# Patient Record
Sex: Female | Born: 1945 | Race: White | Hispanic: No | Marital: Married | Smoking: Never smoker
Health system: Southern US, Community
[De-identification: ages and names within clinical notes are randomized; demographics above are authoritative.]

## PROBLEM LIST (undated history)

## (undated) DIAGNOSIS — Z889 Allergy status to unspecified drugs, medicaments and biological substances status: Secondary | ICD-10-CM

## (undated) DIAGNOSIS — H9190 Unspecified hearing loss, unspecified ear: Secondary | ICD-10-CM

## (undated) DIAGNOSIS — K219 Gastro-esophageal reflux disease without esophagitis: Secondary | ICD-10-CM

## (undated) DIAGNOSIS — D333 Benign neoplasm of cranial nerves: Secondary | ICD-10-CM

## (undated) DIAGNOSIS — I10 Essential (primary) hypertension: Secondary | ICD-10-CM

## (undated) DIAGNOSIS — M5126 Other intervertebral disc displacement, lumbar region: Secondary | ICD-10-CM

## (undated) DIAGNOSIS — R001 Bradycardia, unspecified: Secondary | ICD-10-CM

## (undated) DIAGNOSIS — F32A Depression, unspecified: Secondary | ICD-10-CM

## (undated) DIAGNOSIS — F329 Major depressive disorder, single episode, unspecified: Secondary | ICD-10-CM

## (undated) DIAGNOSIS — K5090 Crohn's disease, unspecified, without complications: Secondary | ICD-10-CM

## (undated) DIAGNOSIS — M1990 Unspecified osteoarthritis, unspecified site: Secondary | ICD-10-CM

## (undated) DIAGNOSIS — E785 Hyperlipidemia, unspecified: Secondary | ICD-10-CM

## (undated) HISTORY — PX: ESOPHAGEAL DILATION: SHX303

## (undated) HISTORY — DX: Crohn's disease, unspecified, without complications: K50.90

## (undated) HISTORY — PX: ABDOMINAL HYSTERECTOMY: SHX81

## (undated) HISTORY — PX: OTHER SURGICAL HISTORY: SHX169

## (undated) HISTORY — DX: Gastro-esophageal reflux disease without esophagitis: K21.9

## (undated) HISTORY — PX: TUBAL LIGATION: SHX77

## (undated) HISTORY — PX: PILONIDAL CYST EXCISION: SHX744

## (undated) HISTORY — DX: Unspecified osteoarthritis, unspecified site: M19.90

## (undated) HISTORY — DX: Hyperlipidemia, unspecified: E78.5

## (undated) HISTORY — DX: Essential (primary) hypertension: I10

## (undated) HISTORY — DX: Benign neoplasm of cranial nerves: D33.3

## (undated) HISTORY — PX: APPENDECTOMY: SHX54

## (undated) HISTORY — PX: CHOLECYSTECTOMY: SHX55

## (undated) HISTORY — PX: COLONOSCOPY: SHX174

---

## 1997-12-27 ENCOUNTER — Emergency Department (HOSPITAL_COMMUNITY): Admission: EM | Admit: 1997-12-27 | Discharge: 1997-12-27 | Payer: Self-pay | Admitting: Emergency Medicine

## 1998-11-12 ENCOUNTER — Encounter: Payer: Self-pay | Admitting: Surgery

## 1998-11-14 ENCOUNTER — Ambulatory Visit (HOSPITAL_COMMUNITY): Admission: RE | Admit: 1998-11-14 | Discharge: 1998-11-15 | Payer: Self-pay | Admitting: Surgery

## 1999-03-04 ENCOUNTER — Encounter (INDEPENDENT_AMBULATORY_CARE_PROVIDER_SITE_OTHER): Payer: Self-pay

## 1999-03-04 ENCOUNTER — Inpatient Hospital Stay (HOSPITAL_COMMUNITY): Admission: RE | Admit: 1999-03-04 | Discharge: 1999-03-06 | Payer: Self-pay | Admitting: Obstetrics and Gynecology

## 1999-04-23 ENCOUNTER — Encounter: Admission: RE | Admit: 1999-04-23 | Discharge: 1999-04-23 | Payer: Self-pay | Admitting: Obstetrics and Gynecology

## 1999-04-23 ENCOUNTER — Encounter: Payer: Self-pay | Admitting: Obstetrics and Gynecology

## 1999-05-29 ENCOUNTER — Encounter: Payer: Self-pay | Admitting: Unknown Physician Specialty

## 1999-05-29 ENCOUNTER — Encounter: Admission: RE | Admit: 1999-05-29 | Discharge: 1999-05-29 | Payer: Self-pay | Admitting: Unknown Physician Specialty

## 2000-05-10 ENCOUNTER — Encounter: Admission: RE | Admit: 2000-05-10 | Discharge: 2000-05-10 | Payer: Self-pay | Admitting: Obstetrics and Gynecology

## 2000-05-10 ENCOUNTER — Encounter: Payer: Self-pay | Admitting: Obstetrics and Gynecology

## 2000-10-15 ENCOUNTER — Encounter: Payer: Self-pay | Admitting: Internal Medicine

## 2000-10-15 ENCOUNTER — Ambulatory Visit (HOSPITAL_COMMUNITY): Admission: RE | Admit: 2000-10-15 | Discharge: 2000-10-15 | Payer: Self-pay | Admitting: Internal Medicine

## 2001-04-29 ENCOUNTER — Encounter (HOSPITAL_COMMUNITY): Admission: RE | Admit: 2001-04-29 | Discharge: 2001-06-28 | Payer: Self-pay | Admitting: Internal Medicine

## 2001-07-18 ENCOUNTER — Encounter: Payer: Self-pay | Admitting: Obstetrics and Gynecology

## 2001-07-18 ENCOUNTER — Encounter: Admission: RE | Admit: 2001-07-18 | Discharge: 2001-07-18 | Payer: Self-pay | Admitting: Obstetrics and Gynecology

## 2001-07-29 ENCOUNTER — Encounter: Payer: Self-pay | Admitting: Internal Medicine

## 2001-07-29 ENCOUNTER — Ambulatory Visit (HOSPITAL_COMMUNITY): Admission: RE | Admit: 2001-07-29 | Discharge: 2001-07-29 | Payer: Self-pay | Admitting: Internal Medicine

## 2001-08-26 ENCOUNTER — Encounter (HOSPITAL_COMMUNITY): Admission: RE | Admit: 2001-08-26 | Discharge: 2001-11-24 | Payer: Self-pay | Admitting: Internal Medicine

## 2001-09-05 ENCOUNTER — Ambulatory Visit (HOSPITAL_COMMUNITY): Admission: RE | Admit: 2001-09-05 | Discharge: 2001-09-05 | Payer: Self-pay | Admitting: Internal Medicine

## 2001-09-05 ENCOUNTER — Encounter: Payer: Self-pay | Admitting: Internal Medicine

## 2001-12-16 ENCOUNTER — Encounter (HOSPITAL_COMMUNITY): Admission: RE | Admit: 2001-12-16 | Discharge: 2002-03-16 | Payer: Self-pay | Admitting: Internal Medicine

## 2002-04-07 ENCOUNTER — Encounter (HOSPITAL_COMMUNITY): Admission: RE | Admit: 2002-04-07 | Discharge: 2002-07-06 | Payer: Self-pay | Admitting: Internal Medicine

## 2002-04-11 ENCOUNTER — Ambulatory Visit (HOSPITAL_COMMUNITY): Admission: RE | Admit: 2002-04-11 | Discharge: 2002-04-11 | Payer: Self-pay | Admitting: Internal Medicine

## 2002-07-31 ENCOUNTER — Encounter (HOSPITAL_COMMUNITY): Admission: RE | Admit: 2002-07-31 | Discharge: 2002-10-29 | Payer: Self-pay | Admitting: Internal Medicine

## 2002-10-10 ENCOUNTER — Encounter: Payer: Self-pay | Admitting: Obstetrics and Gynecology

## 2002-10-10 ENCOUNTER — Encounter: Admission: RE | Admit: 2002-10-10 | Discharge: 2002-10-10 | Payer: Self-pay | Admitting: Obstetrics and Gynecology

## 2002-11-20 ENCOUNTER — Encounter: Payer: Self-pay | Admitting: Internal Medicine

## 2002-11-20 ENCOUNTER — Encounter (HOSPITAL_COMMUNITY): Admission: RE | Admit: 2002-11-20 | Discharge: 2002-11-20 | Payer: Self-pay | Admitting: Internal Medicine

## 2003-01-15 ENCOUNTER — Encounter (HOSPITAL_COMMUNITY): Admission: RE | Admit: 2003-01-15 | Discharge: 2003-04-15 | Payer: Self-pay | Admitting: Internal Medicine

## 2003-05-04 ENCOUNTER — Encounter (HOSPITAL_COMMUNITY): Admission: RE | Admit: 2003-05-04 | Discharge: 2003-08-02 | Payer: Self-pay | Admitting: Internal Medicine

## 2003-06-05 ENCOUNTER — Emergency Department (HOSPITAL_COMMUNITY): Admission: EM | Admit: 2003-06-05 | Discharge: 2003-06-05 | Payer: Self-pay | Admitting: Emergency Medicine

## 2003-08-22 ENCOUNTER — Ambulatory Visit (HOSPITAL_COMMUNITY): Admission: RE | Admit: 2003-08-22 | Discharge: 2003-08-22 | Payer: Self-pay | Admitting: Gastroenterology

## 2003-10-19 ENCOUNTER — Encounter: Admission: RE | Admit: 2003-10-19 | Discharge: 2003-10-19 | Payer: Self-pay | Admitting: Family Medicine

## 2003-10-28 IMAGING — CT CT ABDOMEN W/ CM
1 of 3 series · 14 of 32 positions shown, 19 images · non-contrast
Comparison: none

FINDINGS
CLINICAL DATA: HX CROHN'S DISEASE & PANCREATITIS.
CT ABDOMEN W/CONTRAST
THERE IS SOME LINEAR SCARRING AT BOTH BASES WITH NO NODULE AND NO EFFUSION.  THE LIVER, SPLEEN AND
PANCREAS ARE NORMAL.  THERE IS A 1.2 CM CALCIFIED SPLENIC ARTERY ANEURYSM.  THE KIDNEYS AND
RETROPERITONEAL STRUCTURES THAT ARE SEEN ARE NORMAL.  THERE IS DEGENERATIVE CHANGE OF THE THORACIC
AND LUMBAR SPINE WITH SCOLIOSIS.  THERE ARE ALSO SCATTERED SCLEROTIC FOCI THROUGHOUT THE LOWER
THORACIC AND LUMBAR SPINE.  THE SPLENIC SIZE IS PROMINENT WITH NO FOCAL SPLENIC LESION.
IMPRESSION
1.  NO EVIDENCE FOR PANCREATITIS.
2.  NO ACUTE ABNORMALITY WITHIN THE ABDOMEN.
CT PELVIS W/CONTRAST
ADDITIONAL IMAGES THROUGH THE PELVIS AFTER ORAL AND INTRAVENOUS CONTRAST DEMONSTRATE NO MASS,
ADENOPATHY OR INFLAMMATORY CHANGE.  THERE ARE NO CHANGES OF ACTIVE CROHN'S DISEASE, WITH NO
ABSCESS.  AS WITHIN THE THORACIC AND LUMBAR SPINE, THERE ARE SCLEROTIC FOCI SEEN THROUGHOUT THE
PELVIC BONES.
1.  NO EVIDENCE FOR ACTIVE CROHN'S DISEASE OR ABSCESS.
2.  SCLEROTIC FOCI SEEN THROUGHOUT THE THORACIC AND LUMBAR SPINE AND PELVIS.  I HAVE NO HISTORY
THAT THIS PATIENT HAS A PREVIOUS PRIMARY MALIGNANCY SUCH AS BREAST CARCINOMA.  CONCEIVABLY, THESE
COULD REPRESENT MULTIPLE BONE ISLANDS.  HOWEVER, I WOULD RECOMMEND A BONE SCAN TO FURTHER EVALUATE
THIS PATIENT, AS WELL AS ENSURING THAT THE PATIENT HAS HAD A RECENT MAMMOGRAM.
3.  THE PATIENT HAS HAD A PREVIOUS APPENDECTOMY, HYSTERECTOMY AND CHOLECYSTECTOMY.

[Series 2: abd/pelvis 5.0 b30f · axial · 0.59mm/px · z∈[-438,-94]mm · 14 of 79 slices shown, 19 images]
[im 5/79  soft-tissue]
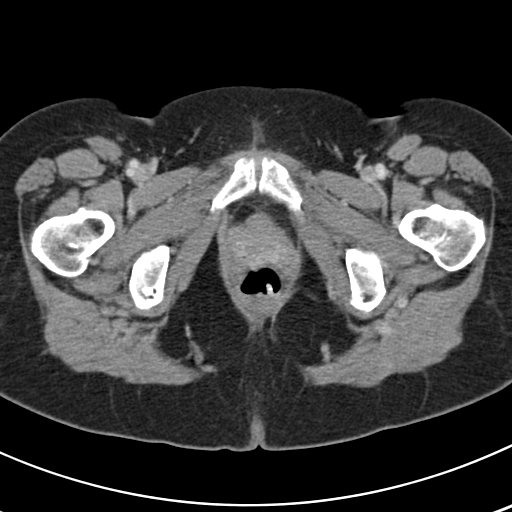
[im 5/79  bone]
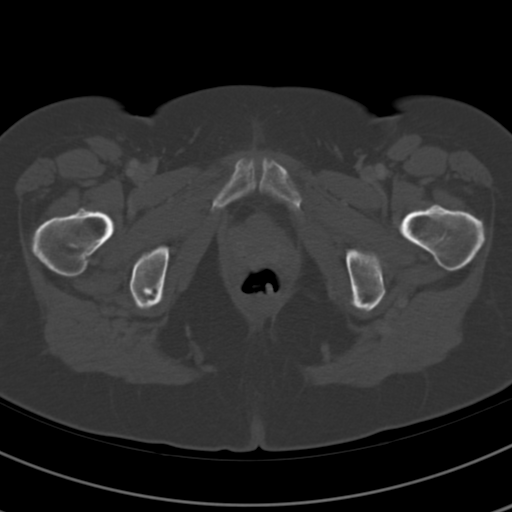
[im 9/79  soft-tissue]
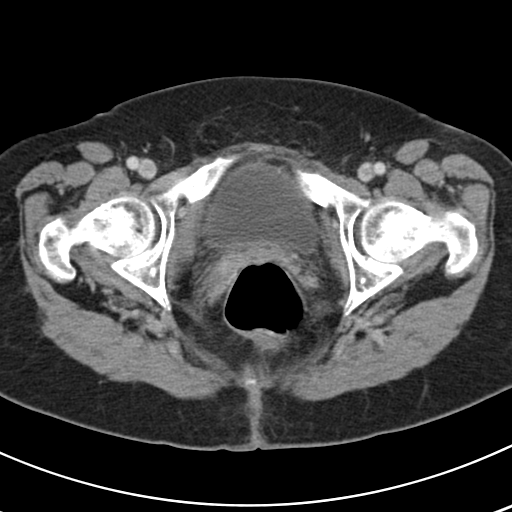
[im 18/79  soft-tissue]
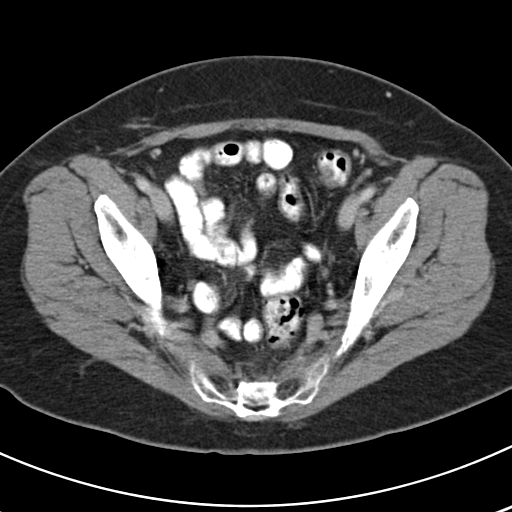
[im 22/79  soft-tissue]
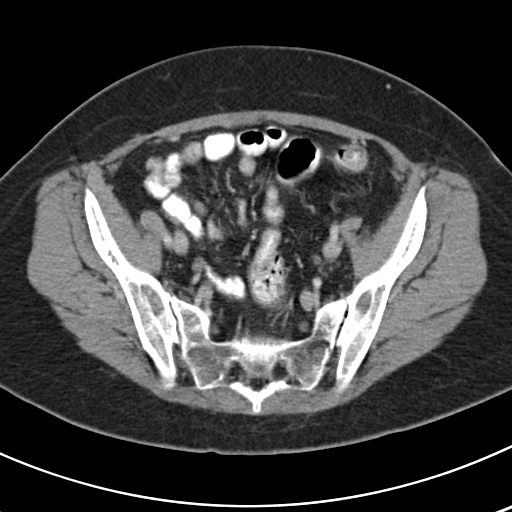
[im 27/79  soft-tissue]
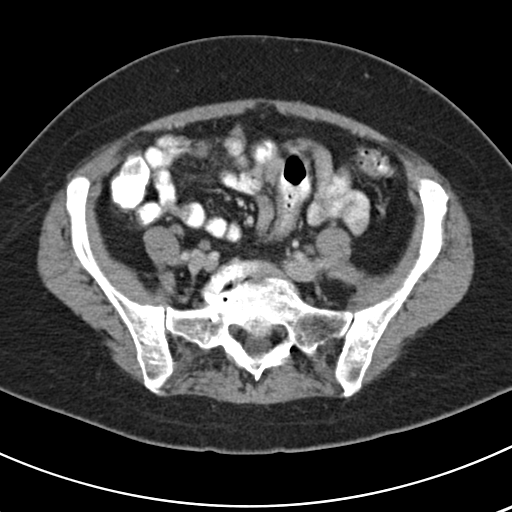
[im 35/79  soft-tissue]
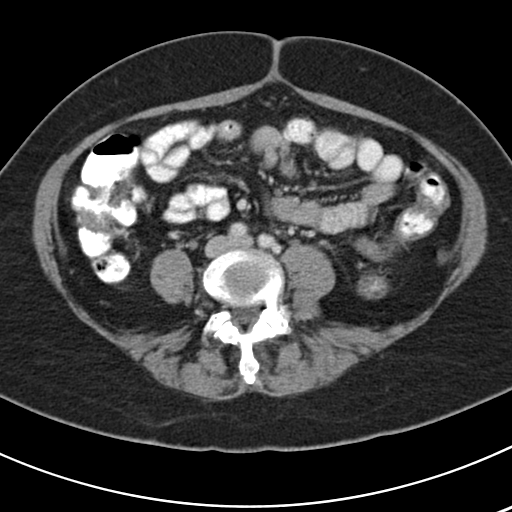
[im 40/79  soft-tissue]
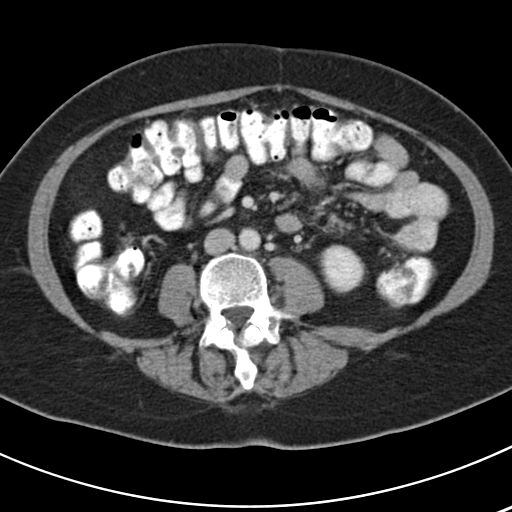
[im 44/79  soft-tissue]
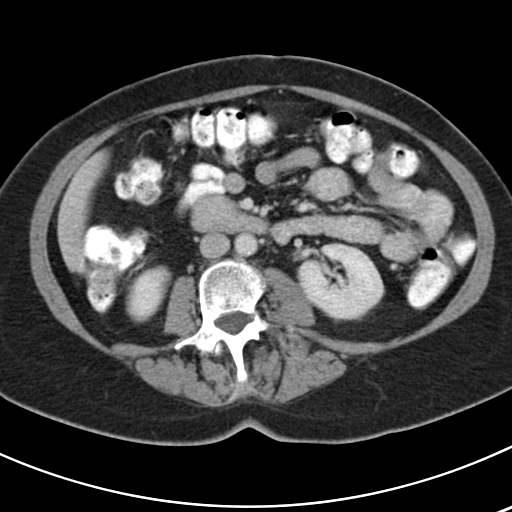
[im 53/79  soft-tissue]
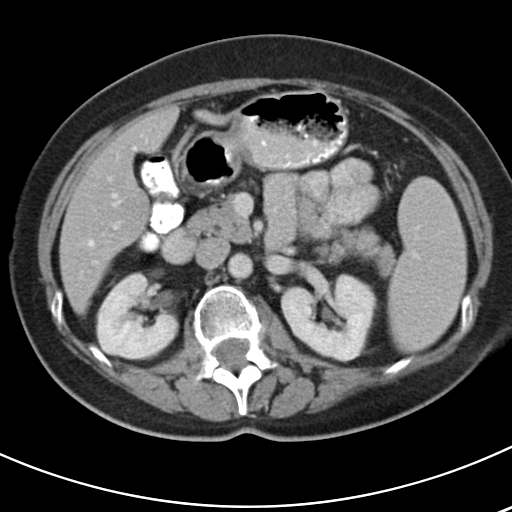
[im 53/79  bone]
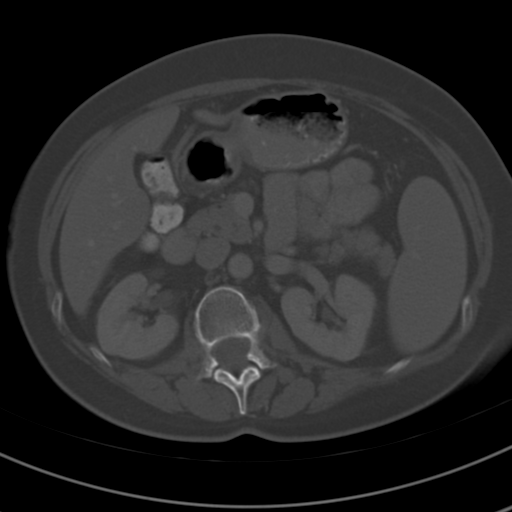
[im 57/79  soft-tissue]
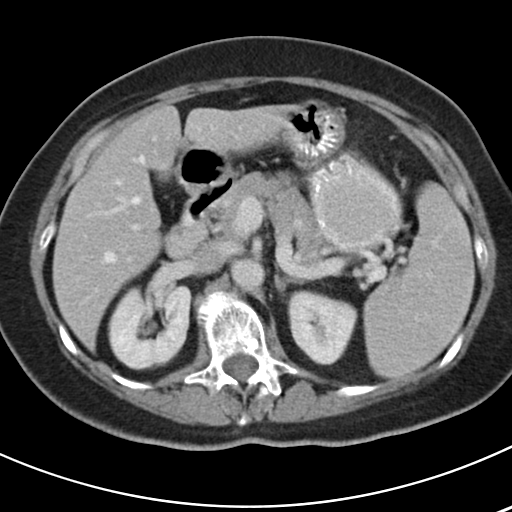
[im 61/79  soft-tissue]
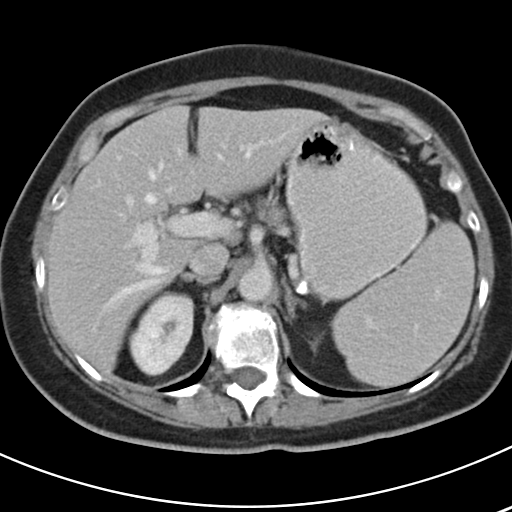
[im 61/79  lung]
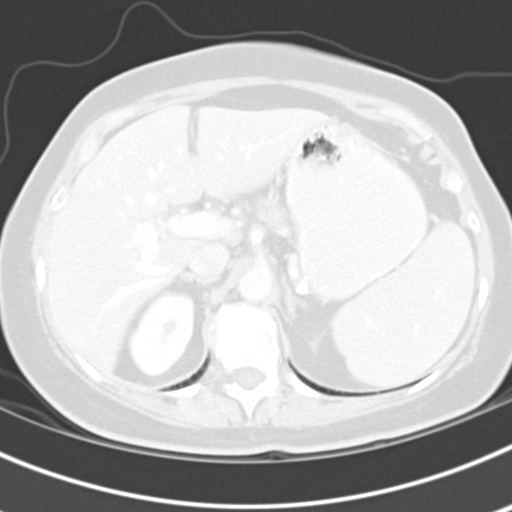
[im 66/79  lung]
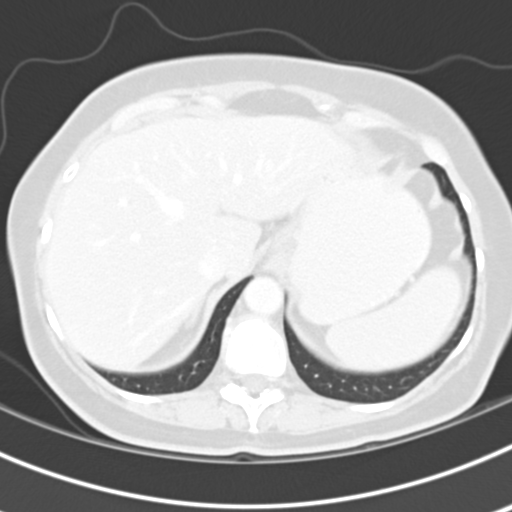
[im 70/79  soft-tissue]
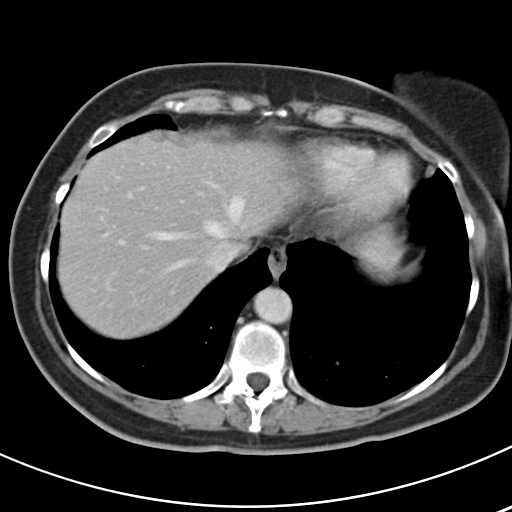
[im 70/79  lung]
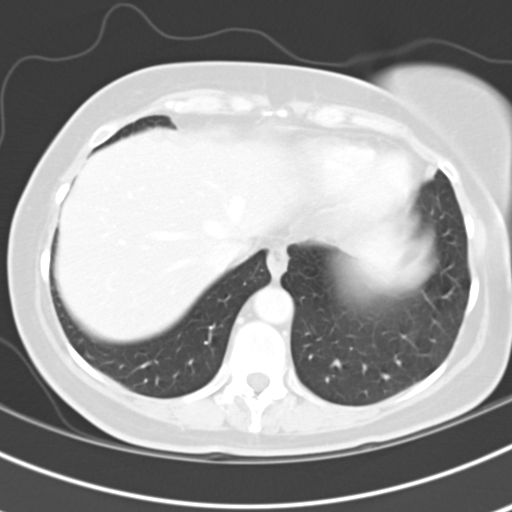
[im 74/79  soft-tissue]
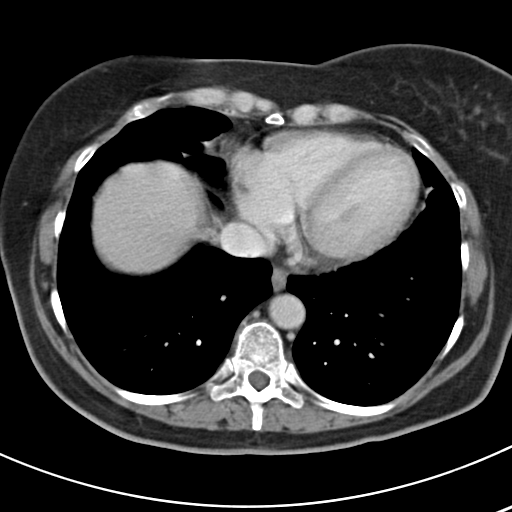
[im 74/79  lung]
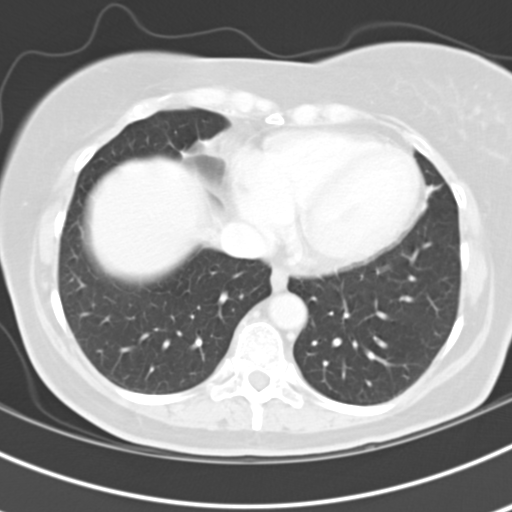

[14 of 32 positions shown; findings below may reference images not displayed]

## 2004-05-19 ENCOUNTER — Encounter: Admission: RE | Admit: 2004-05-19 | Discharge: 2004-05-19 | Payer: Self-pay | Admitting: Gastroenterology

## 2004-06-13 ENCOUNTER — Ambulatory Visit (HOSPITAL_COMMUNITY): Admission: RE | Admit: 2004-06-13 | Discharge: 2004-06-13 | Payer: Self-pay | Admitting: Gastroenterology

## 2004-06-13 ENCOUNTER — Encounter (INDEPENDENT_AMBULATORY_CARE_PROVIDER_SITE_OTHER): Payer: Self-pay | Admitting: Specialist

## 2004-12-23 ENCOUNTER — Encounter: Admission: RE | Admit: 2004-12-23 | Discharge: 2004-12-23 | Payer: Self-pay | Admitting: Unknown Physician Specialty

## 2005-01-01 ENCOUNTER — Encounter: Admission: RE | Admit: 2005-01-01 | Discharge: 2005-01-01 | Payer: Self-pay | Admitting: Unknown Physician Specialty

## 2005-10-19 ENCOUNTER — Encounter: Admission: RE | Admit: 2005-10-19 | Discharge: 2006-01-17 | Payer: Self-pay

## 2006-01-13 ENCOUNTER — Encounter: Admission: RE | Admit: 2006-01-13 | Discharge: 2006-01-13 | Payer: Self-pay | Admitting: Unknown Physician Specialty

## 2006-01-18 ENCOUNTER — Encounter: Admission: RE | Admit: 2006-01-18 | Discharge: 2006-01-26 | Payer: Self-pay

## 2006-03-15 ENCOUNTER — Encounter: Admission: RE | Admit: 2006-03-15 | Discharge: 2006-03-28 | Payer: Self-pay | Admitting: Unknown Physician Specialty

## 2006-03-29 ENCOUNTER — Encounter: Admission: RE | Admit: 2006-03-29 | Discharge: 2006-04-05 | Payer: Self-pay | Admitting: Unknown Physician Specialty

## 2006-10-07 ENCOUNTER — Encounter: Admission: RE | Admit: 2006-10-07 | Discharge: 2006-10-07 | Payer: Self-pay | Admitting: Gastroenterology

## 2006-10-29 ENCOUNTER — Encounter: Admission: RE | Admit: 2006-10-29 | Discharge: 2006-10-29 | Payer: Self-pay | Admitting: Specialist

## 2006-11-25 ENCOUNTER — Encounter: Admission: RE | Admit: 2006-11-25 | Discharge: 2006-11-25 | Payer: Self-pay | Admitting: Specialist

## 2007-01-27 ENCOUNTER — Encounter: Admission: RE | Admit: 2007-01-27 | Discharge: 2007-01-27 | Payer: Self-pay | Admitting: Unknown Physician Specialty

## 2007-02-03 ENCOUNTER — Encounter: Admission: RE | Admit: 2007-02-03 | Discharge: 2007-02-03 | Payer: Self-pay | Admitting: Specialist

## 2007-05-06 ENCOUNTER — Emergency Department (HOSPITAL_COMMUNITY): Admission: EM | Admit: 2007-05-06 | Discharge: 2007-05-06 | Payer: Self-pay | Admitting: Emergency Medicine

## 2007-06-16 ENCOUNTER — Encounter: Admission: RE | Admit: 2007-06-16 | Discharge: 2007-06-16 | Payer: Self-pay | Admitting: Specialist

## 2007-07-29 ENCOUNTER — Encounter: Admission: RE | Admit: 2007-07-29 | Discharge: 2007-07-29 | Payer: Self-pay | Admitting: Gastroenterology

## 2007-09-09 ENCOUNTER — Encounter: Admission: RE | Admit: 2007-09-09 | Discharge: 2007-09-09 | Payer: Self-pay | Admitting: Specialist

## 2007-09-26 ENCOUNTER — Encounter (INDEPENDENT_AMBULATORY_CARE_PROVIDER_SITE_OTHER): Payer: Self-pay | Admitting: Gastroenterology

## 2007-09-26 ENCOUNTER — Ambulatory Visit (HOSPITAL_COMMUNITY): Admission: RE | Admit: 2007-09-26 | Discharge: 2007-09-26 | Payer: Self-pay | Admitting: Gastroenterology

## 2007-11-25 ENCOUNTER — Encounter: Admission: RE | Admit: 2007-11-25 | Discharge: 2007-11-25 | Payer: Self-pay | Admitting: Specialist

## 2008-02-01 ENCOUNTER — Encounter: Admission: RE | Admit: 2008-02-01 | Discharge: 2008-02-01 | Payer: Self-pay | Admitting: Internal Medicine

## 2008-03-12 ENCOUNTER — Encounter: Admission: RE | Admit: 2008-03-12 | Discharge: 2008-03-12 | Payer: Self-pay | Admitting: Specialist

## 2008-03-30 ENCOUNTER — Ambulatory Visit (HOSPITAL_COMMUNITY): Admission: RE | Admit: 2008-03-30 | Discharge: 2008-03-30 | Payer: Self-pay | Admitting: Internal Medicine

## 2008-07-05 ENCOUNTER — Encounter: Admission: RE | Admit: 2008-07-05 | Discharge: 2008-07-05 | Payer: Self-pay | Admitting: Specialist

## 2008-11-09 ENCOUNTER — Encounter: Admission: RE | Admit: 2008-11-09 | Discharge: 2008-11-09 | Payer: Self-pay | Admitting: Specialist

## 2009-01-25 ENCOUNTER — Ambulatory Visit (HOSPITAL_COMMUNITY): Admission: RE | Admit: 2009-01-25 | Discharge: 2009-01-25 | Payer: Self-pay | Admitting: Gastroenterology

## 2009-01-25 ENCOUNTER — Encounter (INDEPENDENT_AMBULATORY_CARE_PROVIDER_SITE_OTHER): Payer: Self-pay | Admitting: Gastroenterology

## 2009-02-01 ENCOUNTER — Encounter: Admission: RE | Admit: 2009-02-01 | Discharge: 2009-02-01 | Payer: Self-pay | Admitting: Internal Medicine

## 2009-02-08 ENCOUNTER — Encounter: Admission: RE | Admit: 2009-02-08 | Discharge: 2009-02-08 | Payer: Self-pay | Admitting: Internal Medicine

## 2009-05-02 ENCOUNTER — Encounter: Admission: RE | Admit: 2009-05-02 | Discharge: 2009-05-02 | Payer: Self-pay | Admitting: Specialist

## 2009-08-22 ENCOUNTER — Encounter: Admission: RE | Admit: 2009-08-22 | Discharge: 2009-08-22 | Payer: Self-pay | Admitting: Internal Medicine

## 2010-02-10 ENCOUNTER — Encounter: Admission: RE | Admit: 2010-02-10 | Discharge: 2010-02-10 | Payer: Self-pay | Admitting: Internal Medicine

## 2010-10-28 ENCOUNTER — Other Ambulatory Visit: Payer: Self-pay | Admitting: Specialist

## 2010-10-28 DIAGNOSIS — M48061 Spinal stenosis, lumbar region without neurogenic claudication: Secondary | ICD-10-CM

## 2010-10-31 ENCOUNTER — Ambulatory Visit
Admission: RE | Admit: 2010-10-31 | Discharge: 2010-10-31 | Disposition: A | Discharge status: Home / Self Care | Payer: BC Managed Care – PPO | Source: Ambulatory Visit | Attending: Specialist | Admitting: Specialist

## 2010-10-31 DIAGNOSIS — M48061 Spinal stenosis, lumbar region without neurogenic claudication: Secondary | ICD-10-CM

## 2010-11-11 NOTE — Op Note (Signed)
Kristine Cook, Kristine Cook                ACCOUNT NO.:  [id number]   MEDICAL RECORD NO.:  [id number]          PATIENT TYPE:  AMB   LOCATION:  ENDO                         FACILITY:  [facility]   PHYSICIAN:  Petra Kuba, M.D.    DATE OF BIRTH:  12/20/1945   DATE OF PROCEDURE:  09/26/2007  DATE OF DISCHARGE:                               OPERATIVE REPORT   PROCEDURE:  Esophagogastroduodenoscopy with biopsy, Savary dilatation.   INDICATIONS:  Patient with dysphagia, upper abdominal pain, abnormal  barium swallow.  Consent was signed after risks, benefits, methods,  options thoroughly discussed with the patient and her husband in the  office.   MEDICINES USED:  Fentanyl 100 mcg, Versed 10 mg.   PROCEDURE:  The video endoscope was inserted by direct vision.  She did  have a tortuous esophagus and a small hiatal hernia.  No significant  ring or stricture was seen.  Scope passed easily into the stomach and  advanced to the antrum where some minimal linear antritis was seen and  advanced through a normal pylorus into a normal duodenal bulb around the  C-loop to a second portion of the duodenum which had a small semisessile  polyp.  Multiple cold biopsies were obtained.  Scope was withdrawn back  to the bulb and a good look there ruled out abnormalities in that  location.  Scope was withdrawn back in the stomach and retroflexed.  High in the cardia, the hiatal hernia was confirmed.  The angularis,  fundus, lesser and greater curve were evaluated on retroflex and on  straight visualization without additional findings.  Scope was slowly  withdrawn back to about 18 cm just below the upper esophageal sphincter,  again confirming normal esophagus.  Scope was then re-advanced to the  antrum and under fluoro guidance, a Savary wire was advanced.  The  customary J loop of the wire was confirmed both endoscopically and under  fluoro.  Scope was removed under fluoro, making sure to keep the wire in  the  proper location and then we proceeded with the Savary 14 and 16 mm  dilators under fluoro in the customary fashion.  The 14 and 16 both  advanced without resistance.  There was trace heme on the 16 only.  Once  the 16 was confirmed in the stomach, the wire was withdrawn back into  the dilator.  Both were removed in tandem.  We stopped the procedure at  this junction.  The patient tolerated the procedure well.  There was no  obvious immediate complication.   ENDOSCOPIC DIAGNOSES:  1. Small hiatal hernia with a tortuous distal esophagus.  2. Minimal antritis.  3. Small duodenal second portion polyp biopsy.  4. Otherwise normal esophagogastroduodenoscopy, therapy Savary      dilatation under fluoro to 16 mm.   PLAN:  Await pathology.  See how the dilation works.  Follow-up p.r.n.  or in six weeks to recheck symptoms and decide any other workup plans or  more aggressive dilatation.          ______________________________  Petra Kuba, M.D.  MEM/MEDQ  D:  09/26/2007  T:  09/26/2007  Job:  [id number]   cc:   Roney Marion, M.D.

## 2010-11-11 NOTE — Op Note (Signed)
Kristine Cook, Kristine Cook                ACCOUNT NO.:  [id number]   MEDICAL RECORD NO.:  [id number]          PATIENT TYPE:  AMB   LOCATION:  ENDO                         FACILITY:  [facility]   PHYSICIAN:  Petra Kuba, M.D.    DATE OF BIRTH:  06/27/1946   DATE OF PROCEDURE:  01/25/2009  DATE OF DISCHARGE:                               OPERATIVE REPORT   PROCEDURE:  Esophagogastroduodenoscopy with polypectomy and dilation.   INDICATIONS:  Patient with dysphagia and duodenal polyp, helped with  dilatation in the past, want to re-dilate and reevaluate the polyp.  Consent was signed after risks, benefits, methods, options thoroughly  discussed multiple times in the past with both the patient and her  husband.   MEDICINES USED:  Benadryl 50 mg, fentanyl 125 mcg, Versed 12.5 mg.   PROCEDURE:  The video endoscope was inserted by direct vision.  The  esophagus was normal.  In the distal esophagus she did have some  tortuosity, a widely patent fibrous ring and a small hiatal hernia.  The  scope was able to be advanced into the stomach and advanced to the  antrum, pertinent for some linear antritis, and around the C-loop to the  second portion of the duodenum, where the previously-seen polyp was  found and seen to be slightly bigger than on previous endoscopies.  We  elected to go ahead and snare the polyp on a setting of 150 at 15 and  very gently used cautery.  The first piece was removed, grabbed with the  snare, suctioned onto the head of the scope and both the scope and the  polyps were removed in tandem and the polyp was recovered.  The scope  was reinserted, again confirming the esophageal findings mentioned  above, which was normal, except for the distal esophagus.  The stomach  was then evaluated on strained retroflexed visualization with good look  at the angularis, lesser and greater curve, cardia and fundus on  retroflexion, which confirmed the hiatal hernia.  Straight visualization  of  the stomach did not reveal any additional findings.  Scope was  reinserted to the duodenum.  Again, the duodenal bulb was normal and we  looked at the polypectomy site, which did seemed to have a little bit of  residual, which was snared, electrocautery applied on the lower setting  as above.  The piece was removed, suctioned through the scope and  collected in the trap.  The polypectomy site was then washed and watched  and there were no signs of bleeding and no signs obviously of residual  polyp.  We elected not to do any further therapy.  There were no signs  of complications.   The patient actually woke up from the procedure at this junction.  She  was not having any pain and we elected to proceed with the dilation and  give her just a little bit more medicine at this junction.  The scope  was withdrawn back into the stomach.  The Savary wire was advanced and  confirmed at the proper position.  Endoscopically and under fluoro the  customary J loop of the wire was confirmed.  The scope was removed,  making sure to keep the wire in the proper position and then we  proceeded, once the scope was removed with the Savary 16-mm dilator  only, which was confirmed under fluoro in the stomach.  The wire was  withdrawn back into the dilator.  There was no resistance on advancing  the dilator.  The dilator and wire were removed in tandem.  There was no  heme on the dilator.  The patient tolerated the procedure well.  There  was no obvious immediate complication.   ENDOSCOPIC DIAGNOSIS:  1. Small hiatal hernia with a tortuous distal esophagus and a widely      patent ring, status post dilation under fluoro to 16 mm without      heme or resistance.  2. Minimal antritis.  3. Second portion of the duodenum polyp slight increase in size,      status post snare x2 on a setting of 150 and 15 with seemingly good      results endoscopically and retrieving both pieces.  4. Otherwise within normal  limits EGD.   PLAN:  1. Await pathology.  2. Follow up p.r.n. or in 2-3 months.  3. No aspirin or nonsteroidals for 2 weeks, which she does not use      anyhow.  4. Clear liquids for 12 hours and slowly advance diet.  5. Call me sooner p.r.n.  6. Await pathology to determine if we should re-look at the      polypectomy site.           ______________________________  Petra Kuba, M.D.     MEM/MEDQ  D:  01/25/2009  T:  01/25/2009  Job:  [id number]   cc:   Larina Earthly, M.D.  Fax: [phone]

## 2010-11-14 NOTE — Op Note (Signed)
NAMEHALIA, Kristine Cook                ACCOUNT NO.:  [id number]   MEDICAL RECORD NO.:  [id number]          PATIENT TYPE:  AMB   LOCATION:  ENDO                         FACILITY:  [facility]   PHYSICIAN:  Petra Kuba, M.D.    DATE OF BIRTH:  [birth year]   DATE OF PROCEDURE:  06/13/2004  DATE OF DISCHARGE:                                 OPERATIVE REPORT   PROCEDURE:  Colonoscopy with biopsy.   INDICATIONS FOR PROCEDURE:  Crohn's with increased symptoms want to  reevaluate.   Consent was signed after risks, benefits, methods, and options were  thoroughly discussed multiple times in the past.   MEDICINES USED:  Demerol 80, Versed 9.   DESCRIPTION OF PROCEDURE:  Rectal inspection was pertinent for small  external hemorrhoids. Digital exam was negative. The pediatric video  adjustable colonoscope was inserted and with abdominal pressure fairly  easily able to advance around the colon to the cecum.  Other than some rare  minimal erythema, no abnormalities were seen on insertion except for left  occasional diverticula.  The cecum was identified by the appendiceal orifice  and the ileocecal valve. In fact, the scope was inserted a short ways into  the terminal ileum which was normal. Photo documentation was obtained.  Scattered biopsies were obtained and put in the first container. The scope  was slowly withdrawn. The prep was adequate. There was some liquid stool  that required washing and suctioning. On slow withdrawal through the colon,  random biopsies were obtained.  The ones on the right side and descending  put in the second container, sigmoid and rectum put on the third container.  Other than the rare small patches of erythema or red spots some of which  were biopsied, no other abnormalities were seen except for the left  occasional diverticula.  Specifically, no signs of significant Crohn's.  Possibly there was just a little more erythema in the sigmoid than anywhere  else but that was  minimal.  Anal rectal pullthrough and retroflexion  confirmed some small hemorrhoids.  The scope was straightened and readvanced  a short ways up the left side of the colon, air was suctioned, scope  removed. The patient tolerated the procedure well. There was no obvious or  immediate complications.   ENDOSCOPIC DIAGNOSIS:  1.  Internal and external hemorrhoids.  2.  Left occasional diverticula.  3.  Rare red spot in the colon questionable significance.  4.  Otherwise within normal limits to the terminal ileum status post      biopsies throughout.   PLAN:  Await pathology.  Slowly weaned steroids. Continue __________ since  that seems to be working better than the Azulfidine.  Followup p.r.n. or in  six weeks to recheck symptoms and see if we can continue to wean steroids at  that junction, although will need to discuss with her neurosurgeon whether  okay to completely wean her off those.      MEM/MEDQ  D:  06/13/2004  T:  06/13/2004  Job:  [id number]   cc:   Roney Marion, M.D.

## 2010-11-26 ENCOUNTER — Other Ambulatory Visit (INDEPENDENT_AMBULATORY_CARE_PROVIDER_SITE_OTHER): Payer: Self-pay | Admitting: General Surgery

## 2010-11-26 DIAGNOSIS — K862 Cyst of pancreas: Secondary | ICD-10-CM

## 2010-11-28 ENCOUNTER — Ambulatory Visit
Admission: RE | Admit: 2010-11-28 | Discharge: 2010-11-28 | Disposition: A | Discharge status: Home / Self Care | Payer: BC Managed Care – PPO | Source: Ambulatory Visit | Attending: General Surgery | Admitting: General Surgery

## 2010-11-28 DIAGNOSIS — K862 Cyst of pancreas: Secondary | ICD-10-CM

## 2010-11-28 MED ORDER — IOHEXOL 300 MG/ML  SOLN
100.0000 mL | Freq: Once | INTRAMUSCULAR | Status: AC | PRN
  Administered 2010-11-28: 100 mL via INTRAVENOUS

## 2010-12-09 ENCOUNTER — Encounter (INDEPENDENT_AMBULATORY_CARE_PROVIDER_SITE_OTHER): Payer: Self-pay | Admitting: General Surgery

## 2010-12-09 ENCOUNTER — Ambulatory Visit (HOSPITAL_COMMUNITY)
Admission: RE | Admit: 2010-12-09 | Discharge: 2010-12-09 | Disposition: A | Discharge status: Home / Self Care | Payer: BC Managed Care – PPO | Source: Ambulatory Visit | Attending: Gastroenterology | Admitting: Gastroenterology

## 2010-12-09 ENCOUNTER — Other Ambulatory Visit: Payer: Self-pay | Admitting: Gastroenterology

## 2010-12-09 DIAGNOSIS — K449 Diaphragmatic hernia without obstruction or gangrene: Secondary | ICD-10-CM | POA: Insufficient documentation

## 2010-12-09 DIAGNOSIS — K219 Gastro-esophageal reflux disease without esophagitis: Secondary | ICD-10-CM | POA: Insufficient documentation

## 2010-12-09 DIAGNOSIS — D649 Anemia, unspecified: Secondary | ICD-10-CM | POA: Insufficient documentation

## 2010-12-09 DIAGNOSIS — D1330 Benign neoplasm of unspecified part of small intestine: Secondary | ICD-10-CM | POA: Insufficient documentation

## 2010-12-09 DIAGNOSIS — K5090 Crohn's disease, unspecified, without complications: Secondary | ICD-10-CM | POA: Insufficient documentation

## 2010-12-09 DIAGNOSIS — K5900 Constipation, unspecified: Secondary | ICD-10-CM | POA: Insufficient documentation

## 2010-12-09 DIAGNOSIS — I10 Essential (primary) hypertension: Secondary | ICD-10-CM | POA: Insufficient documentation

## 2010-12-09 DIAGNOSIS — Z79899 Other long term (current) drug therapy: Secondary | ICD-10-CM | POA: Insufficient documentation

## 2010-12-09 DIAGNOSIS — K228 Other specified diseases of esophagus: Secondary | ICD-10-CM | POA: Insufficient documentation

## 2010-12-09 DIAGNOSIS — K2289 Other specified disease of esophagus: Secondary | ICD-10-CM | POA: Insufficient documentation

## 2010-12-09 DIAGNOSIS — D131 Benign neoplasm of stomach: Secondary | ICD-10-CM | POA: Insufficient documentation

## 2010-12-16 ENCOUNTER — Other Ambulatory Visit: Payer: Self-pay | Admitting: Specialist

## 2010-12-16 DIAGNOSIS — M48061 Spinal stenosis, lumbar region without neurogenic claudication: Secondary | ICD-10-CM

## 2010-12-22 ENCOUNTER — Other Ambulatory Visit: Payer: BC Managed Care – PPO

## 2010-12-23 ENCOUNTER — Ambulatory Visit
Admission: RE | Admit: 2010-12-23 | Discharge: 2010-12-23 | Disposition: A | Discharge status: Home / Self Care | Payer: BC Managed Care – PPO | Source: Ambulatory Visit | Attending: Specialist | Admitting: Specialist

## 2010-12-23 DIAGNOSIS — M48061 Spinal stenosis, lumbar region without neurogenic claudication: Secondary | ICD-10-CM

## 2011-01-10 NOTE — Op Note (Signed)
  Kristine Cook, Kristine Cook                ACCOUNT NO.:  [id number]  MEDICAL RECORD NO.:  [id number]  LOCATION:  WLEN                         FACILITY:  [facility]  PHYSICIAN:  Petra Kuba, M.D.    DATE OF BIRTH:  1946-04-17  DATE OF PROCEDURE:  12/09/2010 DATE OF DISCHARGE:                              OPERATIVE REPORT   PROCEDURE:  Esophagogastroduodenoscopy with biopsy.  INDICATIONS:  The patient with history of duodenal polyp, midepigastric abdominal pain, want to re-evaluate.  Consent was signed after risks, benefits, methods, options thoroughly discussed multiple times in the past with both her and her husband.  MEDICINES USED: 1. Fentanyl 100 mcg. 2. Versed 3 mg. 3. Propofol 70 mg.  PROCEDURE NOTE:  The video endoscope was inserted by direct vision.  She did have a moderate-sized hiatal hernia with a tortuous distal esophagus but no ring or stricture was seen.  Scope passed into the stomach, advanced through a normal antrum, normal pylorus into a normal duodenal bulb and around the C-loop to the second portion of the duodenum. Possibly there was just a touch of residual polyp remaining.  Photo documentation and biopsies were obtained and put in the first container. Scope was advanced a little further down possibly to the third part of the duodenum.  No other abnormalities were seen.  Scope was withdrawn back to the bulb and again no additional duodenal abnormalities were seen.  Scope was withdrawn back to the stomach and retroflexed. Angularis, cardia, fundus, lesser and greater curve were evaluated on retroflex and then straight visualization.  A hiatal hernia was confirmed in the cardia.  Along the distal greater curve, a few tiny small gastric polyps were seen and were cold biopsied and put in a second container.  No other abnormalities were seen in the stomach.  Air was suctioned.  Scope slowly withdrawn again.  A good look at the esophagus was normal except for the  tortuosity and hiatal hernia mentioned above.  Scope was removed.  The patient tolerated the procedure well.  There was no obvious immediate complication.  ENDOSCOPIC DIAGNOSES: 1. Moderate hiatal hernia with a tortuous distal esophagus. 2. Few greater curve tiny small gastric polyps biopsied. 3. Questionable tiny residual second portion of the duodenum polyp,     biopsied. 4. Otherwise within normal limits esophagogastroduodenoscopy,     questionably to the third part of the duodenum.  PLAN:  Await pathology.  Continue present management.  Call me p.r.n. and otherwise follow up in 4 months and determine repeat MRI of the pancreas or endoscopic ultrasound at that juncture based on symptoms and call me sooner p.r.n.          ______________________________ Petra Kuba, M.D.     MEM/MEDQ  D:  12/09/2010  T:  12/09/2010  Job:  [id number]  cc:   Dr. __________  Electronically Signed by Vida Rigger M.D. on 01/10/2011 03:02:11 PM

## 2011-01-13 ENCOUNTER — Other Ambulatory Visit: Payer: Self-pay | Admitting: Internal Medicine

## 2011-01-13 DIAGNOSIS — N63 Unspecified lump in unspecified breast: Secondary | ICD-10-CM

## 2011-01-13 DIAGNOSIS — Z1211 Encounter for screening for malignant neoplasm of colon: Secondary | ICD-10-CM

## 2011-02-16 ENCOUNTER — Ambulatory Visit
Admission: RE | Admit: 2011-02-16 | Discharge: 2011-02-16 | Disposition: A | Discharge status: Home / Self Care | Payer: Medicare Other | Source: Ambulatory Visit | Attending: Internal Medicine | Admitting: Internal Medicine

## 2011-02-16 DIAGNOSIS — N63 Unspecified lump in unspecified breast: Secondary | ICD-10-CM

## 2011-02-27 ENCOUNTER — Other Ambulatory Visit: Payer: Self-pay | Admitting: Dermatology

## 2011-04-27 DIAGNOSIS — D333 Benign neoplasm of cranial nerves: Secondary | ICD-10-CM | POA: Insufficient documentation

## 2011-05-25 ENCOUNTER — Encounter (INDEPENDENT_AMBULATORY_CARE_PROVIDER_SITE_OTHER): Payer: Self-pay | Admitting: Neurosurgery

## 2011-07-14 DIAGNOSIS — M6281 Muscle weakness (generalized): Secondary | ICD-10-CM | POA: Insufficient documentation

## 2011-07-14 DIAGNOSIS — H9190 Unspecified hearing loss, unspecified ear: Secondary | ICD-10-CM | POA: Insufficient documentation

## 2011-07-14 DIAGNOSIS — R251 Tremor, unspecified: Secondary | ICD-10-CM | POA: Insufficient documentation

## 2011-07-14 DIAGNOSIS — M21371 Foot drop, right foot: Secondary | ICD-10-CM | POA: Insufficient documentation

## 2011-07-14 DIAGNOSIS — N393 Stress incontinence (female) (male): Secondary | ICD-10-CM | POA: Insufficient documentation

## 2011-07-30 DIAGNOSIS — G919 Hydrocephalus, unspecified: Secondary | ICD-10-CM | POA: Insufficient documentation

## 2011-12-08 ENCOUNTER — Other Ambulatory Visit (INDEPENDENT_AMBULATORY_CARE_PROVIDER_SITE_OTHER): Payer: Self-pay

## 2011-12-08 DIAGNOSIS — K862 Cyst of pancreas: Secondary | ICD-10-CM

## 2011-12-10 ENCOUNTER — Other Ambulatory Visit: Payer: Medicare Other

## 2011-12-14 ENCOUNTER — Telehealth (INDEPENDENT_AMBULATORY_CARE_PROVIDER_SITE_OTHER): Payer: Self-pay

## 2011-12-14 ENCOUNTER — Ambulatory Visit
Admission: RE | Admit: 2011-12-14 | Discharge: 2011-12-14 | Disposition: A | Discharge status: Home / Self Care | Payer: Medicare Other | Source: Ambulatory Visit | Attending: General Surgery | Admitting: General Surgery

## 2011-12-14 DIAGNOSIS — K862 Cyst of pancreas: Secondary | ICD-10-CM

## 2011-12-14 MED ORDER — IOHEXOL 300 MG/ML  SOLN
100.0000 mL | Freq: Once | INTRAMUSCULAR | Status: AC | PRN
  Administered 2011-12-14: 100 mL via INTRAVENOUS

## 2011-12-14 NOTE — Telephone Encounter (Signed)
Pt notified of ct result and will keep 7-1 appt for follow up.

## 2011-12-28 ENCOUNTER — Ambulatory Visit (INDEPENDENT_AMBULATORY_CARE_PROVIDER_SITE_OTHER): Payer: Medicare Other | Admitting: General Surgery

## 2011-12-28 ENCOUNTER — Encounter (INDEPENDENT_AMBULATORY_CARE_PROVIDER_SITE_OTHER): Payer: Self-pay | Admitting: General Surgery

## 2011-12-28 VITALS — BP 124/68 | HR 49 | Temp 98.2°F | Resp 16 | Ht <= 58 in | Wt 143.0 lb

## 2011-12-28 DIAGNOSIS — K863 Pseudocyst of pancreas: Secondary | ICD-10-CM

## 2011-12-28 DIAGNOSIS — K862 Cyst of pancreas: Secondary | ICD-10-CM

## 2011-12-28 NOTE — Progress Notes (Signed)
Patient ID: Kristine Cook, female   DOB: 07-13-1945, 66 y.o.   MRN: [id number]  Chief Complaint  Patient presents with  . Follow-up    yrly panc check    HPI Kristine Cook is a 66 y.o. female.  This patient returns to see me for long-term followup regarding her pancreas.  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 On MRI there were some subcentimeter hyperintense nonenhancing cystic areas within the pancreatic head. The biliary tract looked normal.  She has a past history of pancreatitis in 1990s thought to be related to medications she was receiving for Crohn's disease. She has been followed intermittently by Dr. Vida Rigger for her Crohn's disease, which is Not very active. Her only GI complaint is constipation. . She doesn't have any nausea or vomiting.  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  She returned to see me after one year to make sure that the pancreatic situation is stable. CT scan of the abdomen on December 14, 2011 shows no evidence of pancreatic lesion. No evidence of pancreatic disease. A small splenic artery aneurysm is stable. The gallbladder and uterus are surgically absent. There is a hiatal hernia which is present as well. HPI  Past Medical History  Diagnosis Date  . Crohn's disease   . Hypertension   . GERD (gastroesophageal reflux disease)   . Acoustic neuroma     benign  . Hyperlipidemia   . Osteoporosis   . Arthritis     Past Surgical History  Procedure Date  . Cholecystectomy   . Appendectomy   . Abdominal hysterectomy   . Shunt placed      Family History  Problem Relation Age of Onset  . Hyperlipidemia Father   . Hypertension Father   . Dementia Father     Social History History  Substance Use Topics  . Smoking status: Never Smoker   . Smokeless tobacco: Not on  file  . Alcohol Use: No    Allergies  Allergen Reactions  . Penicillins Anaphylaxis  . Septra (Bactrim) Nausea Only  . Vicodin (Hydrocodone-Acetaminophen) Diarrhea and Nausea And Vomiting    Current Outpatient Prescriptions  Medication Sig Dispense Refill  . ALPRAZolam (XANAX) 1 MG tablet daily.      Marland Kitchen aspirin 81 MG tablet Take 81 mg by mouth daily.      . balsalazide (COLAZAL) 750 MG capsule Three times a day.      . clidinium-chlordiazePOXIDE (LIBRAX) 2.5-5 MG per capsule Three times a day.      . CRESTOR 5 MG tablet daily.      Marland Kitchen desloratadine (CLARINEX) 5 MG tablet Take 5 mg by mouth daily.      Marland Kitchen DILTIAZEM HCL CD 360 MG 24 hr capsule daily.      Marland Kitchen estradiol (ESTRACE) 2 MG tablet daily.      . FeAsp-FeFum -Suc-C-Thre-B12-FA (MULTIGEN PLUS) 50-101-1 MG TABS daily.      . Flaxseed, Linseed, (RA FLAX SEED OIL 1000 PO) Take by mouth.      Marland Kitchen FLUoxetine (PROZAC) 20 MG capsule 3 (three) times daily.       . folic acid (FOLVITE) 1 MG tablet daily.      . metoCLOPramide (REGLAN) 5 MG tablet daily.      Marland Kitchen MICARDIS 80 MG tablet daily.      Marland Kitchen  Multiple Vitamins-Minerals (CENTRUM SILVER PO) Take by mouth.      . Omega-3 Fatty Acids (FISH OIL) 1200 MG CAPS Take by mouth.      . ondansetron (ZOFRAN) 4 MG tablet Take 4 mg by mouth every 8 (eight) hours as needed.      . pantoprazole (PROTONIX) 40 MG tablet daily.      Marland Kitchen spironolactone (ALDACTONE) 25 MG tablet 0.5 tablets daily.        Review of Systems Review of Systems  Constitutional: Negative for fever, chills and unexpected weight change.  HENT: Negative for hearing loss, congestion, sore throat, trouble swallowing and voice change.   Eyes: Negative for visual disturbance.  Respiratory: Negative for cough and wheezing.   Cardiovascular: Negative for chest pain, palpitations and leg swelling.  Gastrointestinal: Positive for constipation. Negative for nausea, vomiting, abdominal pain, diarrhea, blood in stool, abdominal distention and  anal bleeding.  Genitourinary: Negative for hematuria, vaginal bleeding and difficulty urinating.  Musculoskeletal: Positive for back pain. Negative for arthralgias.  Skin: Negative for rash and wound.  Neurological: Negative for seizures, syncope and headaches.  Hematological: Negative for adenopathy. Does not bruise/bleed easily.  Psychiatric/Behavioral: Negative for confusion.    Blood pressure 124/68, pulse 49, temperature 98.2 F (36.8 C), temperature source Temporal, resp. rate 16, height 4\' 9"  (1.448 m), weight 143 lb (64.864 kg).  Physical Exam Physical Exam  Constitutional: She is oriented to person, place, and time. She appears well-developed and well-nourished. No distress.       In wheelchair. Husband with her.  HENT:  Head: Normocephalic and atraumatic.  Nose: Nose normal.  Mouth/Throat: No oropharyngeal exudate.       Left facial palsy  Eyes: Conjunctivae and EOM are normal. Pupils are equal, round, and reactive to light. Left eye exhibits no discharge. No scleral icterus.  Neck: Neck supple. No JVD present. No tracheal deviation present. No thyromegaly present.       VP shunt right neck.  Cardiovascular: Normal rate, regular rhythm, normal heart sounds and intact distal pulses.   No murmur heard.      Rate slow.  Pulmonary/Chest: Effort normal and breath sounds normal. No respiratory distress. She has no wheezes. She has no rales. She exhibits no tenderness.  Abdominal: Soft. Bowel sounds are normal. She exhibits no distension and no mass. There is no tenderness. There is no rebound and no guarding.    Lymphadenopathy:    She has no cervical adenopathy.  Neurological: She is alert and oriented to person, place, and time. She exhibits normal muscle tone. Coordination normal.  Skin: Skin is warm. No rash noted. She is not diaphoretic. No erythema. No pallor.  Psychiatric: She has a normal mood and affect. Her behavior is normal. Judgment and thought content normal.     Data Reviewed Old records. Recent CT scan.  Assessment    Question of microcysts of pancreas. No evidence of progression 13 months following initial scan. Considering stability of scan and absence of GI symptoms, I think she has no significant pancretic disease.  Tiny splenic artery aneurysm, stable and asymptomatic. No surgical intervention warranted at this time  Status post craniotomy for Schwannoma  VP shunt in place. Residual left facial palsy.  History Crohn's disease, relatively quiescent.  Remote history medication-induced pancreatitis  Degenerative disc disease, lumbar spine.    Plan    I discussed all this findings on the CT scan with her. She and her husband werwe reassured about the absence of pancreatic disease.  I mentioned the splenic artery aneurysm to her. I discussed natural history of this, hopefully will remain stable.    I advised her to discuss periodic imaging studies under the guidance of Dr. Larina Earthly for this.  Return to see me p.r.n.       Angelia Mould. Derrell Lolling, M.D., [facility] General and Minimally invasive Surgery Breast and Colorectal Surgery Office:   [phone] Pager:   [phone]  12/28/2011, 3:00 PM

## 2011-12-28 NOTE — Patient Instructions (Signed)
Your recent CT scan looks very good. There is no evidence of pancreatic disease. You have a tiny splenic artery aneurysm that you need to be aware of, but nothing needs to be done about that either.  Continue regular medical followup with Dr. Felipa Eth.  Return to see Dr. Derrell Lolling if any further issues arise.

## 2012-01-25 ENCOUNTER — Encounter (HOSPITAL_COMMUNITY): Payer: Self-pay

## 2012-01-25 ENCOUNTER — Encounter (HOSPITAL_COMMUNITY)
Admission: RE | Admit: 2012-01-25 | Discharge: 2012-01-25 | Disposition: A | Discharge status: Home / Self Care | Payer: Medicare Other | Source: Ambulatory Visit | Attending: Gastroenterology | Admitting: Gastroenterology

## 2012-01-25 HISTORY — DX: Depression, unspecified: F32.A

## 2012-01-25 HISTORY — DX: Major depressive disorder, single episode, unspecified: F32.9

## 2012-01-25 NOTE — Anesthesia Preprocedure Evaluation (Addendum)
Anesthesia Evaluation  Patient identified by MRN, date of birth, ID band Patient awake    Reviewed: Allergy & Precautions, H&P , NPO status , Patient's Chart, lab work & pertinent test results, reviewed documented beta blocker date and time   Airway Mallampati: II TM Distance: >3 FB Neck ROM: full    Dental No notable dental hx.    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 negative psych ROS   GI/Hepatic negative GI ROS, Neg liver ROS, GERD-  Medicated and Controlled,  Endo/Other  negative endocrine ROS  Renal/GU negative Renal ROS  negative genitourinary   Musculoskeletal   Abdominal   Peds  Hematology negative hematology ROS (+)   Anesthesia Other Findings   Reproductive/Obstetrics negative OB ROS                         Anesthesia Physical Anesthesia Plan  ASA: II  Anesthesia Plan: MAC   Post-op Pain Management:    Induction:   Airway Management Planned:   Additional Equipment:   Intra-op Plan:   Post-operative Plan:   Informed Consent: I have reviewed the patients History and Physical, chart, labs and discussed the procedure including the risks, benefits and alternatives for the proposed anesthesia with the patient or authorized representative who has indicated his/her understanding and acceptance.   Dental Advisory Given  Plan Discussed with: CRNA  Anesthesia Plan Comments:         Anesthesia Quick Evaluation

## 2012-01-26 ENCOUNTER — Encounter (HOSPITAL_COMMUNITY): Payer: Self-pay | Admitting: Gastroenterology

## 2012-01-26 ENCOUNTER — Encounter (HOSPITAL_COMMUNITY): Payer: Self-pay | Admitting: *Deleted

## 2012-01-26 ENCOUNTER — Encounter (HOSPITAL_COMMUNITY)
Admission: RE | Disposition: A | Discharge status: Home / Self Care | Payer: Self-pay | Source: Ambulatory Visit | Attending: Gastroenterology

## 2012-01-26 ENCOUNTER — Ambulatory Visit (HOSPITAL_COMMUNITY)
Admission: RE | Admit: 2012-01-26 | Discharge: 2012-01-26 | Disposition: A | Discharge status: Home / Self Care | Payer: Medicare Other | Source: Ambulatory Visit | Attending: Gastroenterology | Admitting: Gastroenterology

## 2012-01-26 ENCOUNTER — Encounter (HOSPITAL_COMMUNITY): Payer: Self-pay | Admitting: Anesthesiology

## 2012-01-26 ENCOUNTER — Ambulatory Visit (HOSPITAL_COMMUNITY): Payer: Medicare Other | Admitting: Anesthesiology

## 2012-01-26 DIAGNOSIS — K228 Other specified diseases of esophagus: Secondary | ICD-10-CM | POA: Insufficient documentation

## 2012-01-26 DIAGNOSIS — K2960 Other gastritis without bleeding: Secondary | ICD-10-CM | POA: Insufficient documentation

## 2012-01-26 DIAGNOSIS — D131 Benign neoplasm of stomach: Secondary | ICD-10-CM | POA: Insufficient documentation

## 2012-01-26 DIAGNOSIS — R1310 Dysphagia, unspecified: Secondary | ICD-10-CM | POA: Insufficient documentation

## 2012-01-26 DIAGNOSIS — K2289 Other specified disease of esophagus: Secondary | ICD-10-CM | POA: Insufficient documentation

## 2012-01-26 DIAGNOSIS — K5900 Constipation, unspecified: Secondary | ICD-10-CM | POA: Insufficient documentation

## 2012-01-26 HISTORY — PX: HOT HEMOSTASIS: SHX5433

## 2012-01-26 HISTORY — DX: Bradycardia, unspecified: R00.1

## 2012-01-26 HISTORY — DX: Other intervertebral disc displacement, lumbar region: M51.26

## 2012-01-26 HISTORY — PX: ESOPHAGOGASTRODUODENOSCOPY: SHX5428

## 2012-01-26 SURGERY — EGD (ESOPHAGOGASTRODUODENOSCOPY)
Anesthesia: Monitor Anesthesia Care

## 2012-01-26 MED ORDER — MIDAZOLAM HCL 5 MG/5ML IJ SOLN
INTRAMUSCULAR | Status: DC | PRN
  Administered 2012-01-26: 2 mg via INTRAVENOUS

## 2012-01-26 MED ORDER — LACTATED RINGERS IV SOLN
INTRAVENOUS | Status: DC | PRN
  Administered 2012-01-26: 08:00:00 via INTRAVENOUS

## 2012-01-26 MED ORDER — FENTANYL CITRATE 0.05 MG/ML IJ SOLN
INTRAMUSCULAR | Status: DC | PRN
  Administered 2012-01-26: 50 ug via INTRAVENOUS

## 2012-01-26 MED ORDER — PROPOFOL 10 MG/ML IV EMUL
INTRAVENOUS | Status: DC | PRN
  Administered 2012-01-26: 100 ug/kg/min via INTRAVENOUS

## 2012-01-26 MED ORDER — LACTATED RINGERS IV SOLN: INTRAVENOUS | Status: DC

## 2012-01-26 MED ORDER — KETAMINE HCL 50 MG/ML IJ SOLN
INTRAMUSCULAR | Status: DC | PRN
  Administered 2012-01-26: 10 mg via INTRAMUSCULAR

## 2012-01-26 NOTE — Transfer of Care (Signed)
Immediate Anesthesia Transfer of Care Note  Patient: Kristine Cook  Procedure(s) Performed: Procedure(s) (LRB): ESOPHAGOGASTRODUODENOSCOPY (EGD) (N/A) HOT HEMOSTASIS (ARGON PLASMA COAGULATION/BICAP) (N/A)  Patient Location: PACU  Anesthesia Type: MAC  Level of Consciousness: awake, alert , oriented, patient cooperative and responds to stimulation  Airway & Oxygen Therapy: Patient Spontanous Breathing and Patient connected to nasal cannula oxygen  Post-op Assessment: Report given to PACU RN, Post -op Vital signs reviewed and stable and Patient moving all extremities X 4  Post vital signs: Reviewed and stable  Complications: No apparent anesthesia complications

## 2012-01-26 NOTE — Anesthesia Postprocedure Evaluation (Signed)
  Anesthesia Post-op Note  Patient: Kristine Cook  Procedure(s) Performed: Procedure(s) (LRB): ESOPHAGOGASTRODUODENOSCOPY (EGD) (N/A) HOT HEMOSTASIS (ARGON PLASMA COAGULATION/BICAP) (N/A)  Patient Location: PACU  Anesthesia Type: MAC  Level of Consciousness: awake and alert   Airway and Oxygen Therapy: Patient Spontanous Breathing  Post-op Pain: mild  Post-op Assessment: Post-op Vital signs reviewed, Patient's Cardiovascular Status Stable, Respiratory Function Stable, Patent Airway and No signs of Nausea or vomiting  Post-op Vital Signs: stable  Complications: No apparent anesthesia complications

## 2012-01-26 NOTE — Op Note (Signed)
[facility] [address] [place], [place]  [address]  ENDOSCOPY PROCEDURE REPORT  PATIENT:  Kristine Cook, Kristine Cook  MR#:  [id number] BIRTHDATE:  August 17, 1945, 65 yrs. old  GENDER:  female  ENDOSCOPIST:  Vida Rigger, MD Referred by:  Chilton Greathouse, M.D.  PROCEDURE DATE:  01/26/2012 PROCEDURE:  EGD with biopsy, 43239, EGD with dilatation over guidewire ASA CLASS:  Class I INDICATIONS:  history of duodenal polyp and dysphasia  MEDICATIONS:  120 mg propofol 2 mg Versed 50 mcg fentanyl TOPICAL ANESTHETIC: Not used  DESCRIPTION OF PROCEDURE:   After the risks benefits and alternatives of the procedure were thoroughly explained, informed consent was obtained.  The Pentax Gastroscope [id number] endoscope was introduced through the mouth and advanced to the second portion of the duodenum, without limitations.  The instrument was slowly withdrawn as the mucosa was fully examined. To decrease the chance of missed lesions the endoscopy was repeated 2 times in toto and the findings are recorded below but there was no signs of residual duodenal but we did biopsy a small proximal stomach polyp and then proceed with a Savary dilatation in the customary fashion without fluoroscopy guidance using the 16 mm dilator only which passed without resistance and once the dilator and wire were removed the procedure was terminated and the patient tolerated the procedure well <<PROCEDUREIMAGES>>  FINDINGS:  1. Tortuous esophagus status post Savary dilatation to 16 mm at the end of the procedure 2. Small proximal gastric polyp biopsied 3. Minimal antritis 4. Otherwise within normal limits EGD without signs of residual duodenal polyp COMPLICATIONS: None  ENDOSCOPIC IMPRESSION:  Above  RECOMMENDATIONS: Await pathology and see how dilation does and followup with me when necessary or in 6 months and call sooner when necessary  REPEAT EXAM:  Pending pathology probably in 3 years just to recheck  duodenum  ______________________________ Vida Rigger, MD  CC:  Chilton Greathouse, MD  n. Rosalie DoctorVida Rigger at 01/26/2012 09:02 AM  Azucena Fallen, [phone]

## 2012-01-26 NOTE — Progress Notes (Signed)
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

## 2012-01-26 NOTE — Preoperative (Signed)
Beta Blockers   Reason not to administer Beta Blockers:Not Applicable 

## 2012-01-27 ENCOUNTER — Encounter (HOSPITAL_COMMUNITY): Payer: Self-pay | Admitting: Gastroenterology

## 2012-02-19 ENCOUNTER — Other Ambulatory Visit: Payer: Self-pay | Admitting: Internal Medicine

## 2012-02-19 DIAGNOSIS — Z1231 Encounter for screening mammogram for malignant neoplasm of breast: Secondary | ICD-10-CM

## 2012-05-03 ENCOUNTER — Ambulatory Visit
Admission: RE | Admit: 2012-05-03 | Discharge: 2012-05-03 | Disposition: A | Discharge status: Home / Self Care | Payer: Medicare Other | Source: Ambulatory Visit | Attending: Internal Medicine | Admitting: Internal Medicine

## 2012-05-03 DIAGNOSIS — Z1231 Encounter for screening mammogram for malignant neoplasm of breast: Secondary | ICD-10-CM

## 2012-05-05 ENCOUNTER — Other Ambulatory Visit: Payer: Self-pay | Admitting: Internal Medicine

## 2012-05-05 DIAGNOSIS — R928 Other abnormal and inconclusive findings on diagnostic imaging of breast: Secondary | ICD-10-CM

## 2012-05-16 ENCOUNTER — Ambulatory Visit
Admission: RE | Admit: 2012-05-16 | Discharge: 2012-05-16 | Disposition: A | Discharge status: Home / Self Care | Payer: Medicare Other | Source: Ambulatory Visit | Attending: Internal Medicine | Admitting: Internal Medicine

## 2012-05-16 DIAGNOSIS — R928 Other abnormal and inconclusive findings on diagnostic imaging of breast: Secondary | ICD-10-CM

## 2013-05-04 ENCOUNTER — Other Ambulatory Visit: Payer: Self-pay

## 2013-05-04 DIAGNOSIS — Z1231 Encounter for screening mammogram for malignant neoplasm of breast: Secondary | ICD-10-CM

## 2013-05-30 ENCOUNTER — Ambulatory Visit
Admission: RE | Admit: 2013-05-30 | Discharge: 2013-05-30 | Disposition: A | Discharge status: Home / Self Care | Payer: Medicare PPO | Source: Ambulatory Visit

## 2013-05-30 DIAGNOSIS — Z1231 Encounter for screening mammogram for malignant neoplasm of breast: Secondary | ICD-10-CM

## 2013-08-07 ENCOUNTER — Other Ambulatory Visit: Payer: Self-pay | Admitting: Gastroenterology

## 2014-05-03 ENCOUNTER — Other Ambulatory Visit: Payer: Self-pay

## 2014-05-03 DIAGNOSIS — Z1231 Encounter for screening mammogram for malignant neoplasm of breast: Secondary | ICD-10-CM

## 2014-07-16 ENCOUNTER — Ambulatory Visit
Admission: RE | Admit: 2014-07-16 | Discharge: 2014-07-16 | Disposition: A | Discharge status: Home / Self Care | Payer: 59 | Source: Ambulatory Visit

## 2014-07-16 DIAGNOSIS — Z1231 Encounter for screening mammogram for malignant neoplasm of breast: Secondary | ICD-10-CM

## 2014-12-21 ENCOUNTER — Other Ambulatory Visit: Payer: Self-pay | Admitting: Gastroenterology

## 2014-12-24 NOTE — Addendum Note (Signed)
Addended byClarene Essex on: 12/24/2014 11:26 AM   Modules accepted: Orders

## 2015-01-02 ENCOUNTER — Encounter (HOSPITAL_COMMUNITY): Payer: Self-pay | Admitting: *Deleted

## 2015-01-08 ENCOUNTER — Ambulatory Visit (HOSPITAL_COMMUNITY): Payer: Medicare PPO | Admitting: Anesthesiology

## 2015-01-08 ENCOUNTER — Ambulatory Visit (HOSPITAL_COMMUNITY)
Admission: RE | Admit: 2015-01-08 | Discharge: 2015-01-08 | Disposition: A | Discharge status: Home / Self Care | Payer: Medicare PPO | Source: Ambulatory Visit | Attending: Gastroenterology | Admitting: Gastroenterology

## 2015-01-08 ENCOUNTER — Encounter (HOSPITAL_COMMUNITY): Payer: Self-pay

## 2015-01-08 ENCOUNTER — Encounter (HOSPITAL_COMMUNITY)
Admission: RE | Disposition: A | Discharge status: Home / Self Care | Payer: Self-pay | Source: Ambulatory Visit | Attending: Gastroenterology

## 2015-01-08 DIAGNOSIS — K317 Polyp of stomach and duodenum: Secondary | ICD-10-CM | POA: Insufficient documentation

## 2015-01-08 DIAGNOSIS — F329 Major depressive disorder, single episode, unspecified: Secondary | ICD-10-CM | POA: Diagnosis not present

## 2015-01-08 DIAGNOSIS — Z79890 Hormone replacement therapy (postmenopausal): Secondary | ICD-10-CM | POA: Diagnosis not present

## 2015-01-08 DIAGNOSIS — Z982 Presence of cerebrospinal fluid drainage device: Secondary | ICD-10-CM | POA: Diagnosis not present

## 2015-01-08 DIAGNOSIS — Z7982 Long term (current) use of aspirin: Secondary | ICD-10-CM | POA: Diagnosis not present

## 2015-01-08 DIAGNOSIS — R1310 Dysphagia, unspecified: Secondary | ICD-10-CM | POA: Insufficient documentation

## 2015-01-08 DIAGNOSIS — Z8719 Personal history of other diseases of the digestive system: Secondary | ICD-10-CM | POA: Diagnosis not present

## 2015-01-08 DIAGNOSIS — K449 Diaphragmatic hernia without obstruction or gangrene: Secondary | ICD-10-CM | POA: Insufficient documentation

## 2015-01-08 DIAGNOSIS — K219 Gastro-esophageal reflux disease without esophagitis: Secondary | ICD-10-CM | POA: Diagnosis not present

## 2015-01-08 DIAGNOSIS — K5090 Crohn's disease, unspecified, without complications: Secondary | ICD-10-CM | POA: Diagnosis not present

## 2015-01-08 DIAGNOSIS — D333 Benign neoplasm of cranial nerves: Secondary | ICD-10-CM | POA: Insufficient documentation

## 2015-01-08 DIAGNOSIS — I10 Essential (primary) hypertension: Secondary | ICD-10-CM | POA: Diagnosis not present

## 2015-01-08 DIAGNOSIS — K2960 Other gastritis without bleeding: Secondary | ICD-10-CM | POA: Diagnosis not present

## 2015-01-08 DIAGNOSIS — F419 Anxiety disorder, unspecified: Secondary | ICD-10-CM | POA: Diagnosis not present

## 2015-01-08 DIAGNOSIS — Z79899 Other long term (current) drug therapy: Secondary | ICD-10-CM | POA: Insufficient documentation

## 2015-01-08 HISTORY — PX: ESOPHAGOGASTRODUODENOSCOPY (EGD) WITH PROPOFOL: SHX5813

## 2015-01-08 HISTORY — PX: SAVORY DILATION: SHX5439

## 2015-01-08 HISTORY — DX: Unspecified hearing loss, unspecified ear: H91.90

## 2015-01-08 HISTORY — PX: HOT HEMOSTASIS: SHX5433

## 2015-01-08 HISTORY — DX: Allergy status to unspecified drugs, medicaments and biological substances: Z88.9

## 2015-01-08 SURGERY — ESOPHAGOGASTRODUODENOSCOPY (EGD) WITH PROPOFOL
Anesthesia: Monitor Anesthesia Care

## 2015-01-08 MED ORDER — PROPOFOL 10 MG/ML IV BOLUS
INTRAVENOUS | Status: AC
  Filled 2015-01-08: qty 20

## 2015-01-08 MED ORDER — SODIUM CHLORIDE 0.9 % IV SOLN: INTRAVENOUS | Status: DC

## 2015-01-08 MED ORDER — PROPOFOL INFUSION 10 MG/ML OPTIME
INTRAVENOUS | Status: DC | PRN
  Administered 2015-01-08: 140 ug/kg/min via INTRAVENOUS

## 2015-01-08 MED ORDER — PROPOFOL 10 MG/ML IV BOLUS
INTRAVENOUS | Status: DC | PRN
  Administered 2015-01-08: 50 mg via INTRAVENOUS

## 2015-01-08 MED ORDER — LIDOCAINE HCL (CARDIAC) 20 MG/ML IV SOLN
INTRAVENOUS | Status: DC | PRN
  Administered 2015-01-08: 50 mg via INTRAVENOUS

## 2015-01-08 MED ORDER — LIDOCAINE HCL (CARDIAC) 20 MG/ML IV SOLN
INTRAVENOUS | Status: AC
  Filled 2015-01-08: qty 5

## 2015-01-08 MED ORDER — LACTATED RINGERS IV SOLN
INTRAVENOUS | Status: DC
  Administered 2015-01-08: 14:00:00 via INTRAVENOUS

## 2015-01-08 SURGICAL SUPPLY — 14 items

## 2015-01-08 NOTE — Transfer of Care (Signed)
Immediate Anesthesia Transfer of Care Note  Patient: Kristine Cook  Procedure(s) Performed: Procedure(s): ESOPHAGOGASTRODUODENOSCOPY (EGD) WITH PROPOFOL (N/A) HOT HEMOSTASIS (ARGON PLASMA COAGULATION/BICAP) (N/A) SAVORY DILATION (N/A)  Patient Location: PACU  Anesthesia Type:MAC  Level of Consciousness: awake, alert  and oriented  Airway & Oxygen Therapy: Patient Spontanous Breathing and Patient connected to face mask oxygen  Post-op Assessment: Report given to RN and Post -op Vital signs reviewed and stable  Post vital signs: Reviewed and stable  Last Vitals:  Filed Vitals:   01/08/15 1313  BP: 172/66  Pulse: 48  Temp: 36.6 C  Resp: 12    Complications: No apparent anesthesia complications

## 2015-01-08 NOTE — Op Note (Signed)
[facility] [place], [address]   ENDOSCOPY PROCEDURE REPORT  PATIENT: Kristine, Cook  MR#: [id number] BIRTHDATE: Sep 09, 1945 , 68  yrs. old GENDER: female ENDOSCOPIST: Clarene Essex, MD REFERRED BY:  Prince Solian, M.D. PROCEDURE DATE:  01/08/2015 PROCEDURE:  EGD w/ biopsy and Savary dilation of esophagus ASA CLASS:     Class III INDICATIONS:  dysphagia.history of duodenal polyp MEDICATIONS: Propofol 200 mg IV and Lidocaine 50 mg IV TOPICAL ANESTHETIC: none  DESCRIPTION OF PROCEDURE: After the risks benefits and alternatives of the procedure were thoroughly explained, informed consent was obtained.  The Pentax EGD Scope endoscope was introduced through the mouth and advanced to the third portion of the duodenum , Without limitations.  The instrument was slowly withdrawn as the mucosa was fully examined. Estimated blood loss is zero unless otherwise noted in this procedure report.    findings are recorded below       Retroflexed views revealed a hiatal hernia.     The scope was then withdrawn from the patient and the procedure completed.  COMPLICATIONS: There were no immediate complications.  ENDOSCOPIC IMPRESSION: 1. Tiny hiatal hernia with tortuous esophagus 2. Mild antritis 3. Few proximal and distal gastric polyp status post biopsy 4. Otherwise within normal limits to the third portion of the duodenum without signs of duodenal polyp recurrence 5. Therapies Savary dilatation with 16 mm dilator only without resistance with very minimal heme on the dilator questionably from biopsies  RECOMMENDATIONS: await pathology see how dilation works call when necessary otherwise follow-up in 6 months  REPEAT EXAM: as needed or pending biopsy results  eSigned:  Clarene Essex, MD 01/08/2015 3:27 PM Revised: 01/08/2015 3:27 PM   [id number] Avva, MD  CPT CODES: ICD CODES:  The ICD and CPT codes recommended by this software are interpretations  from the data that the clinical staff has captured with the software.  The verification of the translation of this report to the ICD and CPT codes and modifiers is the sole responsibility of the health care institution and practicing physician where this report was generated.  [employer] will not be held responsible for the validity of the ICD and CPT codes included on this report.  AMA assumes no liability for data contained or not contained herein. CPT is a Designer, television/film set of the [employer].  PATIENT NAME:  Kristine, Cook MR#: [id number]

## 2015-01-08 NOTE — Progress Notes (Signed)
Kristine Cook 2:45 PM  Subjective: Patient doing well overall since we last saw her in the office without any new medical problems and still with her periodic dysphagia and no other complaint  Objective: Vital signs stable afebrile no acute distress case discussed with her husband exam please see preassessment evaluation  Assessment: Dysphasia and history of duodenal polyp  Plan: Okay to proceed with endoscopy reevaluate the possible polyp and probably proceed with repeat Savary dilation which only seems to help her with anesthesia assistance  [facility] E  Pager [phone] After 5PM or if no answer call [phone]

## 2015-01-08 NOTE — Discharge Instructions (Signed)
Esophagogastroduodenoscopy Care After Refer to this sheet in the next few weeks. These instructions provide you with information on caring for yourself after your procedure. Your caregiver may also give you more specific instructions. Your treatment has been planned according to current medical practices, but problems sometimes occur. Call your caregiver if you have any problems or questions after your procedure.  HOME CARE INSTRUCTIONS  Do not eat or drink anything until the numbing medicine (local anesthetic) has worn off and your gag reflex has returned. You will know that the local anesthetic has worn off when you can swallow comfortably.  Do not drive for 12 hours after the procedure or as directed by your caregiver.  Only take medicines as directed by your caregiver. SEEK MEDICAL CARE IF:   You cannot stop coughing.  You are not urinating at all or less than usual. SEEK IMMEDIATE MEDICAL CARE IF:  You have difficulty swallowing.  You cannot eat or drink.  You have worsening throat or chest pain.  You have dizziness, lightheadedness, or you faint.  You have nausea or vomiting.  You have chills.  You have a fever.  You have severe abdominal pain.  You have black, tarry, or bloody stools. Document Released: 06/01/2012 Document Reviewed: 06/01/2012 [facility] Patient Information 2015 [place]. This information is not intended to replace advice given to you by your health care provider. Make sure you discuss any questions you have with your health care provider. Call if question or problem or in one week for biopsy report and otherwise follow-up as needed or in 6 month per our routine

## 2015-01-08 NOTE — Anesthesia Preprocedure Evaluation (Signed)
Anesthesia Evaluation  Patient identified by MRN, date of birth, ID band Patient awake    Reviewed: Allergy & Precautions, H&P , NPO status , Patient's Chart, lab work & pertinent test results, reviewed documented beta blocker date and time   Airway Mallampati: II  TM Distance: >3 FB Neck ROM: full    Dental no notable dental hx. 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 negative psych ROS   GI/Hepatic negative GI ROS, Neg liver ROS, GERD-  Medicated and Controlled,  Endo/Other  negative endocrine ROS  Renal/GU negative Renal ROS  negative genitourinary   Musculoskeletal   Abdominal   Peds  Hematology negative hematology ROS (+)   Anesthesia Other Findings   Reproductive/Obstetrics negative OB ROS                             Anesthesia Physical  Anesthesia Plan  ASA: II  Anesthesia Plan: MAC   Post-op Pain Management:    Induction:   Airway Management Planned: Natural Airway  Additional Equipment:   Intra-op Plan:   Post-operative Plan:   Informed Consent: I have reviewed the patients History and Physical, chart, labs and discussed the procedure including the risks, benefits and alternatives for the proposed anesthesia with the patient or authorized representative who has indicated his/her understanding and acceptance.   Dental Advisory Given  Plan Discussed with: CRNA  Anesthesia Plan Comments:         Anesthesia Quick Evaluation

## 2015-01-08 NOTE — Anesthesia Postprocedure Evaluation (Signed)
  Anesthesia Post-op Note  Patient: Kristine Cook  Procedure(s) Performed: Procedure(s) (LRB): ESOPHAGOGASTRODUODENOSCOPY (EGD) WITH PROPOFOL (N/A) HOT HEMOSTASIS (ARGON PLASMA COAGULATION/BICAP) (N/A) SAVORY DILATION (N/A)  Patient Location: PACU  Anesthesia Type: MAC  Level of Consciousness: awake and alert   Airway and Oxygen Therapy: Patient Spontanous Breathing  Post-op Pain: mild  Post-op Assessment: Post-op Vital signs reviewed, Patient's Cardiovascular Status Stable, Respiratory Function Stable, Patent Airway and No signs of Nausea or vomiting  Last Vitals:  Filed Vitals:   01/08/15 1540  BP: 146/48  Pulse: 54  Temp:   Resp: 17    Post-op Vital Signs: stable   Complications: No apparent anesthesia complications

## 2015-01-09 ENCOUNTER — Encounter (HOSPITAL_COMMUNITY): Payer: Self-pay | Admitting: Gastroenterology

## 2015-06-14 ENCOUNTER — Other Ambulatory Visit: Payer: Self-pay

## 2015-06-14 DIAGNOSIS — Z1231 Encounter for screening mammogram for malignant neoplasm of breast: Secondary | ICD-10-CM

## 2015-08-06 ENCOUNTER — Ambulatory Visit
Admission: RE | Admit: 2015-08-06 | Discharge: 2015-08-06 | Disposition: A | Discharge status: Home / Self Care | Payer: 59 | Source: Ambulatory Visit

## 2015-08-06 DIAGNOSIS — Z1231 Encounter for screening mammogram for malignant neoplasm of breast: Secondary | ICD-10-CM

## 2016-07-13 ENCOUNTER — Other Ambulatory Visit: Payer: Self-pay | Admitting: Internal Medicine

## 2016-07-13 DIAGNOSIS — Z1231 Encounter for screening mammogram for malignant neoplasm of breast: Secondary | ICD-10-CM

## 2016-08-24 ENCOUNTER — Ambulatory Visit
Admission: RE | Admit: 2016-08-24 | Discharge: 2016-08-24 | Disposition: A | Discharge status: Home / Self Care | Payer: Medicare Other | Source: Ambulatory Visit | Attending: Internal Medicine | Admitting: Internal Medicine

## 2016-08-24 DIAGNOSIS — Z1231 Encounter for screening mammogram for malignant neoplasm of breast: Secondary | ICD-10-CM

## 2016-08-26 ENCOUNTER — Other Ambulatory Visit: Payer: Self-pay | Admitting: Internal Medicine

## 2016-08-26 DIAGNOSIS — R928 Other abnormal and inconclusive findings on diagnostic imaging of breast: Secondary | ICD-10-CM

## 2016-08-28 ENCOUNTER — Ambulatory Visit
Admission: RE | Admit: 2016-08-28 | Discharge: 2016-08-28 | Disposition: A | Discharge status: Home / Self Care | Payer: Medicare Other | Source: Ambulatory Visit | Attending: Internal Medicine | Admitting: Internal Medicine

## 2016-08-28 ENCOUNTER — Other Ambulatory Visit: Payer: Self-pay | Admitting: Internal Medicine

## 2016-08-28 DIAGNOSIS — R928 Other abnormal and inconclusive findings on diagnostic imaging of breast: Secondary | ICD-10-CM

## 2017-01-23 ENCOUNTER — Encounter (HOSPITAL_COMMUNITY): Payer: Self-pay

## 2017-01-23 ENCOUNTER — Ambulatory Visit (HOSPITAL_COMMUNITY)
Admission: EM | Admit: 2017-01-23 | Discharge: 2017-01-23 | Disposition: A | Discharge status: Home / Self Care | Payer: Medicare Other | Attending: Family Medicine | Admitting: Family Medicine

## 2017-01-23 DIAGNOSIS — B029 Zoster without complications: Secondary | ICD-10-CM

## 2017-01-23 MED ORDER — CAPSAICIN 0.025 % EX CREA: TOPICAL_CREAM | Freq: Two times a day (BID) | CUTANEOUS | 0 refills | Status: DC

## 2017-01-23 MED ORDER — VALACYCLOVIR HCL 1 G PO TABS: 1000.0000 mg | ORAL_TABLET | Freq: Three times a day (TID) | ORAL | 0 refills | Status: DC

## 2017-01-23 NOTE — ED Provider Notes (Signed)
  [place]   [id number] 01/23/17 Arrival Time: [place]:  1. Herpes zoster without complication     Meds ordered this encounter  Medications  . valACYclovir (VALTREX) 1000 MG tablet    Sig: Take 1 tablet (1,000 mg total) by mouth 3 (three) times daily.    Dispense:  21 tablet    Refill:  0  . capsaicin (ZOSTRIX) 0.025 % cream    Sig: Apply topically 2 (two) times daily.    Dispense:  60 g    Refill:  0   May use over the counter ibuprofen or acetaminophen as needed. F/U with any concerns or if pain increases. Reviewed expectations re: course of current medical issues. Questions answered. Outlined signs and symptoms indicating need for more acute intervention. Patient verbalized understanding. After Visit Summary given.   SUBJECTIVE:  Kristine Cook is a 71 y.o. female who presents with complaint of a rash on upper R back for a few days. Some discomfort. "Burning pain". Questions shingles. No h/o. No self treatment. OTC analgesics with some help. Afebrile. No new exposures or medications. No recent illnesses.  ROS: As per HPI.   OBJECTIVE:  Vitals:   01/23/17 1538  BP: (!) 145/74  Pulse: 60  Temp: 98.1 F (36.7 C)  TempSrc: Oral  SpO2: 94%     General appearance: alert; no distress HEENT: normocephalic; atraumatic; conjunctivae normal; TMs normal; nasal mucosa normal; oral mucosa normal Skin: R upper back extending around side with patchy erythema; some induration; few small vesicles   Allergies  Allergen Reactions  . Penicillins Anaphylaxis  . Septra [Bactrim] Nausea Only  . Vicodin [Hydrocodone-Acetaminophen] Diarrhea and Nausea And Vomiting    PMHx, SurgHx, SocialHx, Medications, and Allergies were reviewed in the Visit Navigator and updated as appropriate.      Vanessa Kick, MD 01/23/17 [phone]

## 2017-01-23 NOTE — ED Triage Notes (Signed)
Pt presents today with rash on back that occurred on Thursday. Started off with a small bump that turned into a cluster of bumps after while. States she has pain in on the right side of her upper back that wraps around towards her right rib area. Does have an itching burning pain associated with her rash. Thinks it is possible shingles.

## 2017-07-22 ENCOUNTER — Other Ambulatory Visit: Payer: Self-pay | Admitting: Internal Medicine

## 2017-07-22 DIAGNOSIS — Z139 Encounter for screening, unspecified: Secondary | ICD-10-CM

## 2017-09-03 ENCOUNTER — Ambulatory Visit
Admission: RE | Admit: 2017-09-03 | Discharge: 2017-09-03 | Disposition: A | Discharge status: Home / Self Care | Payer: Medicare Other | Source: Ambulatory Visit | Attending: Internal Medicine | Admitting: Internal Medicine

## 2017-09-03 DIAGNOSIS — Z139 Encounter for screening, unspecified: Secondary | ICD-10-CM

## 2018-02-05 DIAGNOSIS — M79671 Pain in right foot: Secondary | ICD-10-CM | POA: Insufficient documentation

## 2018-02-10 DIAGNOSIS — S82831A Other fracture of upper and lower end of right fibula, initial encounter for closed fracture: Secondary | ICD-10-CM | POA: Insufficient documentation

## 2018-07-22 ENCOUNTER — Ambulatory Visit
Admission: RE | Admit: 2018-07-22 | Discharge: 2018-07-22 | Disposition: A | Discharge status: Home / Self Care | Payer: Medicare Other | Source: Ambulatory Visit | Attending: Internal Medicine | Admitting: Internal Medicine

## 2018-07-22 ENCOUNTER — Other Ambulatory Visit: Payer: Self-pay | Admitting: Internal Medicine

## 2018-07-22 DIAGNOSIS — R410 Disorientation, unspecified: Secondary | ICD-10-CM

## 2018-08-08 ENCOUNTER — Other Ambulatory Visit: Payer: Self-pay | Admitting: Internal Medicine

## 2018-08-08 DIAGNOSIS — Z1231 Encounter for screening mammogram for malignant neoplasm of breast: Secondary | ICD-10-CM

## 2018-08-19 ENCOUNTER — Encounter: Payer: Self-pay | Admitting: Neurology

## 2018-09-02 ENCOUNTER — Other Ambulatory Visit: Payer: Medicare Other

## 2018-09-02 ENCOUNTER — Encounter: Payer: Self-pay | Admitting: Neurology

## 2018-09-02 ENCOUNTER — Ambulatory Visit: Payer: Medicare Other | Admitting: Neurology

## 2018-09-02 ENCOUNTER — Other Ambulatory Visit (INDEPENDENT_AMBULATORY_CARE_PROVIDER_SITE_OTHER): Payer: Medicare Other

## 2018-09-02 ENCOUNTER — Other Ambulatory Visit: Payer: Self-pay

## 2018-09-02 VITALS — BP 156/64 | HR 54 | Ht 59.0 in | Wt 145.0 lb

## 2018-09-02 DIAGNOSIS — R4689 Other symptoms and signs involving appearance and behavior: Secondary | ICD-10-CM

## 2018-09-02 DIAGNOSIS — R4189 Other symptoms and signs involving cognitive functions and awareness: Secondary | ICD-10-CM | POA: Diagnosis not present

## 2018-09-02 DIAGNOSIS — R531 Weakness: Secondary | ICD-10-CM

## 2018-09-02 DIAGNOSIS — G20 Parkinson's disease: Secondary | ICD-10-CM

## 2018-09-02 MED ORDER — DONEPEZIL HCL 10 MG PO TABS: ORAL_TABLET | ORAL | 11 refills | Status: DC

## 2018-09-02 MED ORDER — DIVALPROEX SODIUM ER 250 MG PO TB24: ORAL_TABLET | ORAL | 11 refills | Status: DC

## 2018-09-02 NOTE — Progress Notes (Signed)
NEUROLOGY CONSULTATION NOTE  Kristine Cook MRN: [id number] DOB: 05-Sep-1945  Referring provider: Dr. Prince Solian Primary care provider: Dr. Prince Solian  Reason for consult:  confusion  Dear Dr Dagmar Hait:  Thank you for your kind referral of Kristine Cook for consultation of the above symptoms. Although her history is well known to you, please allow me to reiterate it for the purpose of our medical record. The patient was accompanied to the clinic by her husband who also provides collateral information. Records and images were personally reviewed where available.  HISTORY OF PRESENT ILLNESS: This is a pleasant 73 year old right-handed woman with a history of hypertension, hyperlipidemia, left acoustic neuroma s/p gamma knife in 2005, shunt placement in 2004, presenting for evaluation of confusion. She states she is 46. When asked about memory, she states "it worries me, I don't remember my children's names sometimes." Her husband started noticing minor memory changes when she retired in 2009, she has always confused names of distant family members, but symptoms significantly changed in April 2019. She and her husband went on a trip to [place], and he noted she was repeating herself, constantly asking the same thing. She still thought she was in [place] when they got back. She had previously been having difficulty sleeping and taking higher doses of melatonin, her husband thought this was the caused and stopped melatonin which seemed to help for a while. She started having noticeable difficulties again on 07/21/2018 with "unusual talk." She would not recognize her husband, asking her husband or neighbor to look for her husband when he was standing right in front of her. She does not realize she is at home and wants to put her shoes on to go home. Today her husband went for bloodwork and he left her in the car, when he came back she was convinced they left her father (who is deceased) in the  doctor's office. She states she sees and hears her father or family member in the living room, this tends to get worse in the evening. She would get more combative when her husband tries to convince her otherwise. This was initially sporadic in January, but for the past 1-2 weeks has been a daily issue. She was started on Depakote 250mg  daily for behavioral changes last Saturday, no side effects. Her husband reports that she has always had walking difficulties for several years, balance has been worsening the past 2 years, but now forgets that she needs assistance to ambulate and would stand up and fall. Her husband is mostly concerned because these changes seem quite sudden. He has noticed that she is having more difficulty feeding herself with her right hand, with the fork ending up at her chin. She is tired all the time. Her husband denies any staring/unresponsive episodes, but he can tell it is not registering where she is or what is going on. Her husband has been managing medications and finances for the past 25 years. She stopped driving many years ago due to her eyesight/peripheral vision issues. Sleep is good.   She has infrequent headaches. She has occasional spinning when she stands up. She has had some horizontal diplopia since the neuroma. She has neck and back pain. There is occasional tingling in both feet. She has bowel and bladder incontinence and wears adult diapers. She has had tremors for several years affecting writing and using utensils. No anosmia. Her father had vascular dementia. No history of significant head injuries or alcohol use.  She had a head CT on 07/22/2018 with stable right VP shunt, ventricles mildly more prominent compared to Jan 2012, however the sulci are also mildly more prominent. Increased ventricular prominence is favored to be due to interval volume loss/atrophy rather than shunt malfuncation, increasing white matter changes. Images were reviewed by his neurosurgeon  Dr. Salomon Fick, imaging was stable from prior and follow-up MRI was recommended for 2026. Her last MRI brain was in 05/2016 reporting "Similar size and configuration the ventricular system with unchanged right frontal approach ventriculostomy catheter, with tip in the frontal horn of the right lateral ventricle. Similar size of enhancing left CPA angle mass which measures approximately 21 x 16 mm (previously 23 x 17 mm), compatible with a vestibular schwannoma. As previously demonstrated, this lesion widens the porus acousticus, indents the middle cerebellar peduncle, and closely approximates the cisternal portion of the left 5th cranial nerve. Similar extensive T2 FLAIR signal in the periventricular and subcortical white matter, which is nonspecific, but most commonly reflect the sequela of chronic ischemic microvascular disease. Grossly normal flow-related signal in the major intracranial arteries and dural sinuses."   PAST MEDICAL HISTORY: Past Medical History:  Diagnosis Date  . Acoustic neuroma (HCC)    benign; underwent some radiation-Lt ear deafness. wears hearing aid right  . Arthritis    hands, back, knees  . Bradycardia   . Crohn's disease ([place])   . Depression   . GERD (gastroesophageal reflux disease)   . Hearing deficit    deaf left ear, right ear hearing aid  . Hx of seasonal allergies   . Hyperlipidemia   . Hypertension   . Lumbar herniated disc   . Osteoporosis     PAST SURGICAL HISTORY: Past Surgical History:  Procedure Laterality Date  . ABDOMINAL HYSTERECTOMY    . APPENDECTOMY    . CHOLECYSTECTOMY    . COLONOSCOPY    . ESOPHAGEAL DILATION    . ESOPHAGOGASTRODUODENOSCOPY  01/26/2012   Procedure: ESOPHAGOGASTRODUODENOSCOPY (EGD);  Surgeon: Jeryl Columbia, MD;  Location: [facility];  Service: Endoscopy;  Laterality: N/A;  . ESOPHAGOGASTRODUODENOSCOPY (EGD) WITH PROPOFOL N/A 01/08/2015   Procedure: ESOPHAGOGASTRODUODENOSCOPY (EGD) WITH PROPOFOL;  Surgeon: Clarene Essex, MD;   Location: [facility];  Service: Endoscopy;  Laterality: N/A;  . HOT HEMOSTASIS  01/26/2012   Procedure: HOT HEMOSTASIS (ARGON PLASMA COAGULATION/BICAP);  Surgeon: Jeryl Columbia, MD;  Location: [facility];  Service: Endoscopy;  Laterality: N/A;  . HOT HEMOSTASIS N/A 01/08/2015   Procedure: HOT HEMOSTASIS (ARGON PLASMA COAGULATION/BICAP);  Surgeon: Clarene Essex, MD;  Location: [facility];  Service: Endoscopy;  Laterality: N/A;  . PILONIDAL CYST EXCISION    . SAVORY DILATION N/A 01/08/2015   Procedure: SAVORY DILATION;  Surgeon: Clarene Essex, MD;  Location: [facility];  Service: Endoscopy;  Laterality: N/A;  . shunt placed      RT side of head '[facility] "programmable" to assist fluid drainage.  . TUBAL LIGATION      MEDICATIONS: Current Outpatient Medications on File Prior to Visit  Medication Sig Dispense Refill  . ALPRAZolam (XANAX) 1 MG tablet Take 1 mg by mouth daily.     Marland Kitchen aspirin 81 MG tablet Take 81 mg by mouth daily.    . balsalazide (COLAZAL) 750 MG capsule Take 2,250 mg by mouth Three times a day.     . capsaicin (ZOSTRIX) 0.025 % cream Apply topically 2 (two) times daily. 60 g 0  . clidinium-chlordiazePOXIDE (LIBRAX) 2.5-5 MG per capsule Take 1 capsule by mouth  Three times a day.     . CRESTOR 5 MG tablet Take 5 mg by mouth daily.     Marland Kitchen desloratadine (CLARINEX) 5 MG tablet Take 5 mg by mouth daily as needed (allergies).     . DILTIAZEM HCL CD 360 MG 24 hr capsule Take 360 mg by mouth daily.     Marland Kitchen estradiol (ESTRACE) 1 MG tablet Take 1 mg by mouth daily.    . FeAsp-FeFum -Suc-C-Thre-B12-FA (MULTIGEN PLUS) 50-101-1 MG TABS Take 1 tablet by mouth daily.     . Flaxseed, Linseed, (RA FLAX SEED OIL 1000 PO) Take 1 capsule by mouth daily.     Marland Kitchen FLUoxetine (PROZAC) 20 MG capsule Take 20 mg by mouth 3 (three) times daily.     . folic acid (FOLVITE) 1 MG tablet Take 1 mg by mouth daily.     Marland Kitchen levothyroxine (SYNTHROID, LEVOTHROID) 50 MCG tablet Take 50 mcg by mouth daily.  11    . metoCLOPramide (REGLAN) 5 MG tablet Take 5 mg by mouth daily.     Marland Kitchen MICARDIS 80 MG tablet Take 80 mg by mouth daily.     . Multiple Vitamins-Minerals (CENTRUM SILVER PO) Take by mouth.    . Omega-3 Fatty Acids (FISH OIL) 1200 MG CAPS Take 1 capsule by mouth 3 (three) times daily.     . ondansetron (ZOFRAN) 4 MG tablet Take 4 mg by mouth every 8 (eight) hours as needed for nausea.     . pantoprazole (PROTONIX) 40 MG tablet Take 40 mg by mouth 2 (two) times daily.     Marland Kitchen spironolactone (ALDACTONE) 25 MG tablet Take 12.5 mg by mouth daily.     . valACYclovir (VALTREX) 1000 MG tablet Take 1 tablet (1,000 mg total) by mouth 3 (three) times daily. 21 tablet 0   No current facility-administered medications on file prior to visit.     ALLERGIES: Allergies  Allergen Reactions  . Penicillins Anaphylaxis  . Septra [Bactrim] Nausea Only  . Vicodin [Hydrocodone-Acetaminophen] Diarrhea and Nausea And Vomiting    FAMILY HISTORY: Family History  Problem Relation Age of Onset  . Hyperlipidemia Father   . Hypertension Father   . Dementia Father     SOCIAL HISTORY: Social History   Socioeconomic History  . Marital status: Married    Spouse name: Not on file  . Number of children: Not on file  . Years of education: Not on file  . Highest education level: Not on file  Occupational History  . Not on file  Social Needs  . Financial resource strain: Not on file  . Food insecurity:    Worry: Not on file    Inability: Not on file  . Transportation needs:    Medical: Not on file    Non-medical: Not on file  Tobacco Use  . Smoking status: Never Smoker  . Smokeless tobacco: Never Used  Substance and Sexual Activity  . Alcohol use: No  . Drug use: No  . Sexual activity: Not on file  Lifestyle  . Physical activity:    Days per week: Not on file    Minutes per session: Not on file  . Stress: Not on file  Relationships  . Social connections:    Talks on phone: Not on file    Gets  together: Not on file    Attends religious service: Not on file    Active member of club or organization: Not on file    Attends meetings of clubs  or organizations: Not on file    Relationship status: Not on file  . Intimate partner violence:    Fear of current or ex partner: Not on file    Emotionally abused: Not on file    Physically abused: Not on file    Forced sexual activity: Not on file  Other Topics Concern  . Not on file  Social History Narrative  . Not on file    REVIEW OF SYSTEMS: Constitutional: No fevers, chills, or sweats, no generalized fatigue, change in appetite Eyes: No visual changes, double vision, eye pain Ear, nose and throat: No hearing loss, ear pain, nasal congestion, sore throat Cardiovascular: No chest pain, palpitations Respiratory:  No shortness of breath at rest or with exertion, wheezes GastrointestinaI: No nausea, vomiting, diarrhea, abdominal pain, fecal incontinence Genitourinary:  No dysuria, urinary retention or frequency Musculoskeletal:  + neck pain, back pain Integumentary: No rash, pruritus, skin lesions Neurological: as above Psychiatric: No depression, insomnia, anxiety Endocrine: No palpitations, fatigue, diaphoresis, mood swings, change in appetite, change in weight, increased thirst Hematologic/Lymphatic:  No anemia, purpura, petechiae. Allergic/Immunologic: no itchy/runny eyes, nasal congestion, recent allergic reactions, rashes  PHYSICAL EXAM: Vitals:   09/02/18 1303  BP: (!) 156/64  Pulse: (!) 54  SpO2: 96%   General: No acute distress, sitting on wheelchair Head:  Normocephalic/atraumatic Eyes: Fundoscopic exam shows bilateral sharp discs, no vessel changes, exudates, or hemorrhages Neck: supple, no paraspinal tenderness, full range of motion Back: No paraspinal tenderness Heart: regular rate and rhythm Lungs: Clear to auscultation bilaterally. Vascular: No carotid bruits. Skin/Extremities: No rash, no edema Neurological  Exam: Mental status: alert and oriented to person, place, month, no dysarthria or aphasia, Fund of knowledge is reduced. She is noted to have delay in answering questions but answers appropriately. She states she is 29. Recent and remote memory are impaired. Attention and concentration are reduced.   Able to name objects and repeat phrases.  Montreal Cognitive Assessment  09/02/2018  Visuospatial/ Executive (0/5) 2  Naming (0/3) 2  Attention: Read list of digits (0/2) 2  Attention: Read list of letters (0/1) 0  Attention: Serial 7 subtraction starting at 100 (0/3) 0  Language: Repeat phrase (0/2) 1  Language : Fluency (0/1) 0  Abstraction (0/2) 1  Delayed Recall (0/5) 0  Orientation (0/6) 3  Total 11  Adjusted Score (based on education) 12   Cranial nerves: CN I: not tested CN II: pupils equal, round and reactive to light, visual fields intact, fundi unremarkable. CN III, IV, VI:  full range of motion, no nystagmus, no ptosis CN V: facial sensation intact CN VII: upper and lower face symmetric CN VIII: hearing intact to finger rub CN IX, X: gag intact, uvula midline CN XI: sternocleidomastoid and trapezius muscles intact CN XII: tongue midline Bulk & Tone: +cogwheeling, L>R, no fasciculations. Motor: 5/5 throughout with no pronator drift but note of decreased fine finger movements on left hand and orbiting around left arm (indicating mild left-sided weakness) Sensation: intact to light touch, cold, pin, vibration and joint position sense.  No extinction to double simultaneous stimulation.  Deep Tendon Reflexes: +2 throughout, no ankle clonus Plantar responses: downgoing bilaterally Cerebellar: no incoordination on finger to nose testing Gait: unable to stand without 1-2 person assist, she is quite ataxic and cannot take a step, very unsteady Tremor: there is a resting tremor noted in both hands mostly affecting the right 5th digit and left 2nd digit, no postural or endpoint  tremor  IMPRESSION:  This is a 73 year old right-handed woman with a history of  hypertension, hyperlipidemia, left acoustic neuroma s/p gamma knife in 2005, shunt placement in 2004, presenting for evaluation of confusion. Symptoms have started rather acutely last January 2020, her neurological exam shows mild left-sided weakness, with parkinsonian features. She is quite ataxic. MOCA score today 12/30 indicating moderate dementia. She is also having visual and auditory hallucinations. Etiology of symptoms unclear, from her neuroma perspective, she does not need imaging until 2026, however with recent change in symptoms, recommend doing MRI brain with and without contrast to assess for underlying structural abnormality. Bloodwork for rapidly progressive dementia will be ordered. For behavioral changes, we will try increasing Depakote to 250mg  BID. We also discussed how Aricept can help with behaviors associated with dementia, she will start Donepezil 5mg  daily for 2 weeks, then increase to 10mg  daily. Side effects discussed. She will be referred for home PT and OT. Continue 24/7 supervision. Follow-up in 6-8 weeks, they know to call for any changes.   Thank you for allowing me to participate in the care of this patient. Please do not hesitate to call for any questions or concerns.   Ellouise Newer, M.D.  CC: Dr. Dagmar Hait, Dr. Salomon Fick

## 2018-09-02 NOTE — Patient Instructions (Addendum)
1. Schedule MRI brain with and without contrast. We have sent a referral to [place] [facility] for your MRI and they will call you directly to schedule your appt. They are located at [place]. If you need to contact them directly please call [phone].  2. Bloodwork for B12, RPR, ammonia, ESR, ANA, anti-thyroglobulin antibodies, anti-thyroid peroxidase antibodies. Your provider has requested that you have labwork completed today. Please go to [facility] ([id number]) on the second floor of this building before leaving the office today. You do not need to check in. If you are not called within 15 minutes please check with the front desk.   3. Increase Depakote ER 267m: take 1 tablet twice a day  4. After a week of increasing Depakote, start Donepezil 132m Take 1/2 tablet daily for 2 weeks, then increase to 1 tablet daily  5. Refer for home PT/OT. We have sent a referral. [facility] will contact you to schedule a time to schedule your visits.   6. Follow-up in 6-8 weeks, call for any changes

## 2018-09-05 LAB — THYROGLOBULIN LEVEL: THYROGLOBULIN: 12.3 ng/mL

## 2018-09-05 LAB — ANA: Anti Nuclear Antibody(ANA): NEGATIVE

## 2018-09-05 LAB — VITAMIN B12: Vitamin B-12: 1760 pg/mL — ABNORMAL HIGH (ref 200–1100)

## 2018-09-05 LAB — AMMONIA: AMMONIA: 53 umol/L (ref <=72)

## 2018-09-05 LAB — RPR: RPR: NONREACTIVE

## 2018-09-05 LAB — SEDIMENTATION RATE: Sed Rate: 31 mm/h — ABNORMAL HIGH (ref 0–30)

## 2018-09-05 LAB — THYROID PEROXIDASE ANTIBODY: Thyroperoxidase Ab SerPl-aCnc: 1 IU/mL (ref <9)

## 2018-09-07 ENCOUNTER — Telehealth: Payer: Self-pay

## 2018-09-07 DIAGNOSIS — R413 Other amnesia: Secondary | ICD-10-CM

## 2018-09-07 NOTE — Telephone Encounter (Signed)
-----   Message from Cameron Sprang, MD sent at 09/07/2018  9:18 AM EDT ----- Pls let patient and her husband know that bloodwork was normal, thanks

## 2018-09-07 NOTE — Telephone Encounter (Signed)
Order for MRI with and without contrast to be done at [facility] placed in system

## 2018-09-07 NOTE — Telephone Encounter (Signed)
Spoke with pt's husband, Trilby Drummer, relaying results below.  He states that [facility] has contact pt to schedule MRI, and was told that they would not be able to do procedure there due to pt's programmable shunt.  States that he was told he would received call about where pt could have MRI done.  I advised that I would send order to [facility] and get MRI scheduled for pt.  Trilby Drummer states that he and pt would prefer MRI to be scheduled on a Friday due to Melvin's work schedule.

## 2018-09-09 ENCOUNTER — Other Ambulatory Visit: Payer: Self-pay

## 2018-09-09 ENCOUNTER — Ambulatory Visit
Admission: RE | Admit: 2018-09-09 | Discharge: 2018-09-09 | Disposition: A | Discharge status: Home / Self Care | Payer: Medicare Other | Source: Ambulatory Visit | Attending: Internal Medicine | Admitting: Internal Medicine

## 2018-09-09 DIAGNOSIS — Z1231 Encounter for screening mammogram for malignant neoplasm of breast: Secondary | ICD-10-CM

## 2018-09-16 ENCOUNTER — Telehealth: Payer: Self-pay | Admitting: Neurology

## 2018-09-16 NOTE — Telephone Encounter (Signed)
Patient husband would like to talk to someone about the medication depakote. He thinks the dosage needs to be changed

## 2018-09-19 ENCOUNTER — Telehealth: Payer: Self-pay | Admitting: Neurology

## 2018-09-19 ENCOUNTER — Telehealth: Payer: Self-pay | Admitting: *Deleted

## 2018-09-19 NOTE — Telephone Encounter (Signed)
Patient's husband called back and said that his wife is getting confused and having anger issues when she is just taking depakote once a day but she was extremely weak taking it bid.  Is there anything we can add with this?

## 2018-09-19 NOTE — Telephone Encounter (Signed)
Friday, Kristine Cook called after hours service and stated that she has been dizzy and unsteady while walking since increasing Depakote from 250mg  daily to 250mg  twice daily a couple of weeks ago.  I advised to reduce dose back to once daily and contact the office Monday.

## 2018-09-20 ENCOUNTER — Telehealth: Payer: Self-pay | Admitting: Neurology

## 2018-09-20 NOTE — Telephone Encounter (Signed)
She seemed so weak, barely open eyes and hardly stand up with BID dose of Depakote. Depakote reduced to once a day and she has been more herself; only had one episode of paranoia but he thinks he can work through that. With BID, not as good control with her bladder as well. She has been taking 1/2 tab Donepezil for a week also, we agreed to continue with plan to increase after another week since she improved with reducing Depakote (symptoms do not appear due to Aricept). Over the past few days, he has also noticed difficulty with fine finger movements on right hand, she is having difficulty using a fork and feeding herself, but can grab things with right hand. Proceed with MRI as scheduled on Friday.

## 2018-09-20 NOTE — Telephone Encounter (Signed)
See phone note

## 2018-09-21 ENCOUNTER — Ambulatory Visit
Admission: RE | Admit: 2018-09-21 | Discharge: 2018-09-21 | Disposition: A | Discharge status: Home / Self Care | Payer: Medicare Other | Source: Ambulatory Visit | Attending: Neurology | Admitting: Neurology

## 2018-09-21 ENCOUNTER — Other Ambulatory Visit: Payer: Self-pay

## 2018-09-21 DIAGNOSIS — G20 Parkinson's disease: Secondary | ICD-10-CM

## 2018-09-21 DIAGNOSIS — R531 Weakness: Secondary | ICD-10-CM

## 2018-09-21 DIAGNOSIS — R4689 Other symptoms and signs involving appearance and behavior: Secondary | ICD-10-CM

## 2018-09-21 DIAGNOSIS — R4189 Other symptoms and signs involving cognitive functions and awareness: Secondary | ICD-10-CM

## 2018-09-22 ENCOUNTER — Telehealth: Payer: Self-pay | Admitting: Neurology

## 2018-09-22 NOTE — Telephone Encounter (Signed)
MRI at [facility] and patient has a programmable shunt. He said they went there yesterday and they couldn't do this because of her shunt. Please call him back at [phone]. Thanks!

## 2018-09-23 ENCOUNTER — Other Ambulatory Visit: Payer: Medicare Other

## 2018-09-26 NOTE — Telephone Encounter (Signed)
Husband called- relayed msg from [facility] about waiting to schedule MRI at [facility] when they are open to do reg MRI. Aricept is causing her to not sleep well. And he is wanting to take her off of it. She is having a hard time picking up fork with her fingers. HE wants to know how to stop it. Went from half a pill to full pill last couple of days. Please call him back at [phone]. Thanks!

## 2018-09-26 NOTE — Telephone Encounter (Signed)
Noted, thanks!

## 2018-09-26 NOTE — Telephone Encounter (Signed)
Spoke with pt's husband, Trilby Drummer, advising no need to wean Aricept, OK to stop.  Trilby Drummer states that over the weekend pt was "trying to go home" at 2AM." and that it has been very rough the last few days/nights.  Asks if possible to increase Depakote 250mg  from 2Tab QHS to 1Tab AM and 2Tabs QHS.  I told him that I did not see a problem with that.  Trilby Drummer and I agree that he and Dangela will keep April 16 appointment in case there are issues with medications.  Trilby Drummer also wants to keep appointment to talk with Dr. Delice Lesch about possible replacement for Aricept.   Trilby Drummer states that he would prefer pt to have MRI in [place] but does not want to take pt to [facility] at this time ([place]).  I advised that we will get things situated when things settle down.    Verified mobile telephone number for [facility] appointment.  [place] mobile # [phone]

## 2018-10-03 NOTE — Telephone Encounter (Signed)
Spoke with [employer] below.  He states that this AM pt was unable to get out of bed.  during our conversation pt's daughter told Trilby Drummer that pt seems a little more coherent, making some sense while talking.  Carter Kitten that if he felt pt needs to be seen, to contact PCP or take pt to Urgent Care Facility.  Trilby Drummer verbalized understanding.

## 2018-10-03 NOTE — Telephone Encounter (Signed)
Pt's husband, Trilby Drummer, contact office and states that pt is lethargic.  She can grasp a cup, but cannot pick it up and can no longer pick up a spoon/fork.  I advised Trilby Drummer to reduce Depakote back down to 2 tabs daily, advised that he may give either BID or both tabs QHS, whichever works best for pt.   Let Trilby Drummer know that I would send message to Dr. Delice Lesch and return call with any recommendations.   Trilby Drummer CB# [phone]

## 2018-10-03 NOTE — Telephone Encounter (Signed)
Would only take the Depakote at bedtime if she is lethargic. Try doing 2 tabs qhs, then if still lethargic, 1 tab qhs. If still lethargic, may need brain scan (even head CT if unable to do MRI due to shunt). Thanks

## 2018-10-05 ENCOUNTER — Telehealth: Payer: Self-pay | Admitting: Neurology

## 2018-10-05 ENCOUNTER — Telehealth: Payer: Self-pay

## 2018-10-05 ENCOUNTER — Other Ambulatory Visit: Payer: Self-pay | Admitting: Neurology

## 2018-10-05 DIAGNOSIS — R4182 Altered mental status, unspecified: Secondary | ICD-10-CM

## 2018-10-05 DIAGNOSIS — R413 Other amnesia: Secondary | ICD-10-CM

## 2018-10-05 NOTE — Telephone Encounter (Signed)
Verbal per Dr. Delice Lesch - OK to dc Depakote / STAT CT.  Spoke with pt's husband, Trilby Drummer, advising OK to D/c Depakote.  Also advised stat CT orders placed.    Called pam with [facility] to advise of STAT orders.

## 2018-10-05 NOTE — Telephone Encounter (Signed)
Called [facility], spoke with Fabi. Dr. Delice Lesch wanted CT head to be STAT. CTA was cx, Pt to be contacted now to be scheduled.

## 2018-10-05 NOTE — Telephone Encounter (Signed)
Patient's spouse called regarding Jakeira taking her Depakote medication. Her husband does not feel comfortable giving her anymore. He said she was going to wean off but he wants to stop her completely. He was also asking about an Emergency CT Scan.  Please Call. Thanks

## 2018-10-07 ENCOUNTER — Other Ambulatory Visit: Payer: Self-pay

## 2018-10-07 ENCOUNTER — Ambulatory Visit
Admission: RE | Admit: 2018-10-07 | Discharge: 2018-10-07 | Disposition: A | Discharge status: Home / Self Care | Payer: Medicare Other | Source: Ambulatory Visit | Attending: Neurology | Admitting: Neurology

## 2018-10-07 DIAGNOSIS — R4182 Altered mental status, unspecified: Secondary | ICD-10-CM

## 2018-10-11 ENCOUNTER — Telehealth: Payer: Self-pay

## 2018-10-11 NOTE — Telephone Encounter (Signed)
Spoke with pt's husband, Trilby Drummer, relaying message below.  He states that pt seems to be getting better.  Can grasp a cup now, but still not utensils, has better bladder control and her ankle swelling has gone down Trilby Drummer never mentioned swelling before).  States that he would like to wait a few more week before starting a different medications, however agrees that pt does need something.  I took this opportunity to go over pt's medication lists with Trilby Drummer, as pt has upcoming appt with Dr. Delice Lesch on April 16.

## 2018-10-11 NOTE — Telephone Encounter (Signed)
-----   Message from Cameron Sprang, MD sent at 10/10/2018  8:53 AM EDT ----- Pls let Mr. Gowens know that Jerzi's scan did not show any change from her brain scan in January. There is no evidence of bleed, obvious stroke, although MRI would be more sensitive for stroke. How is she feeling off the medications? Is she less drowsy? Thanks

## 2018-10-13 ENCOUNTER — Telehealth (INDEPENDENT_AMBULATORY_CARE_PROVIDER_SITE_OTHER): Payer: Medicare Other | Admitting: Neurology

## 2018-10-13 ENCOUNTER — Encounter: Payer: Self-pay | Admitting: Neurology

## 2018-10-13 ENCOUNTER — Other Ambulatory Visit: Payer: Self-pay

## 2018-10-13 VITALS — BP 138/74 | Temp 98.1°F | Ht 59.0 in | Wt 145.0 lb

## 2018-10-13 DIAGNOSIS — G20 Parkinson's disease: Secondary | ICD-10-CM

## 2018-10-13 DIAGNOSIS — R4182 Altered mental status, unspecified: Secondary | ICD-10-CM

## 2018-10-13 DIAGNOSIS — R4689 Other symptoms and signs involving appearance and behavior: Secondary | ICD-10-CM

## 2018-10-13 DIAGNOSIS — R4189 Other symptoms and signs involving cognitive functions and awareness: Secondary | ICD-10-CM

## 2018-10-13 NOTE — Progress Notes (Signed)
Virtual Visit via Video Note The purpose of this virtual visit is to provide medical care while limiting exposure to the novel coronavirus.    Consent was obtained for video visit:  Yes.   Answered questions that patient had about telehealth interaction:  Yes.   I discussed the limitations, risks, security and privacy concerns of performing an evaluation and management service by telemedicine. I also discussed with the patient that there may be a patient responsible charge related to this service. The patient expressed understanding and agreed to proceed.  Pt location: Home Physician Location: office Name of referring provider:  Prince Solian, MD I connected with Narissa Beaufort Bartolini at patients initiation/request on 10/13/2018 at 11:30 AM EDT by video enabled telemedicine application and verified that I am speaking with the correct person using two identifiers. Pt MRN:  [id number] Pt DOB:  1945-09-06 Video Participants:  Tonia Brooms;  Garrel Ridgel (husband)   History of Present Illness:  The patient was last seen a month ago for sudden change in cognition/behavior that started in January 2020. She had a head CT in January 2020 which showed ventricles mildly more prominent, favored to be due to interval volume loss/atrophy rather than shunt malfunction. Her neurosurgeon reviewed images as well and agreed. She was started on Depakote for behavioral changes and Donepezil for dementia last month. Her husband has called our office several times since her visit to report side effects on medications. She became dizzy, unsteady, and incontinent of urine when he increased Depakote 250mg  to BID. He also reported more difficulty with fine finger movements on right hand, more difficulty using a fork and feeding herself. He was advised to reduce to once a day and these seemed to improve with dose reduction. She had also started Donepezil. These seemed to improve with reduction in Depakote, he started  Donepezil. He called 2 days after reporting Donepezil seemed to worsen sleep and was instructed to stop it. He reported worsening behavioral changes, wanting to "go home at 2AM" and asked if he could increase Depakote again. He then reported that she could no longer pick up a spoon/fork and was lethargic. Repeat head CT done which I personally reviewed did not show any changes from Jan 2020 scan. He stopped the Depakote completely a week ago. He feels that she has been improving, she can now grab a cup and hold something in her fingers (last week could not pick up piece of bacon). She could not open/close her hands last week and had even more trouble standing, this is better as well. He noticed her face was puffy, this has resolved. She could not make it to the toilet for a week, now she is back to her baseline. Off the Depakote, she is more confused and agitated again, hallucinating, and not sleeping well. She is irrational, wanting him to do things he cannot do, and he is unable to talk her out of it, making her angrier (Depakote helped with this). He has noticed she tends to lean a lot to her right side. He does not feel there is focal weakness but feels she is weak diffusely. She has been unable to do the MRI brain due to current Covid-19 restrictions.   History on Initial Assessment 09/02/2018:This is a pleasant 73 year old right-handed woman with a history of hypertension, hyperlipidemia, left acoustic neuroma s/p gamma knife in 2005, shunt placement in 2004, presenting for evaluation of confusion. She states she is 73. When asked about memory, she  states "it worries me, I don't remember my children's names sometimes." Her husband started noticing minor memory changes when she retired in 2009, she has always confused names of distant family members, but symptoms significantly changed in April 2019. She and her husband went on a trip to [place], and he noted she was repeating herself, constantly asking the  same thing. She still thought she was in [place] when they got back. She had previously been having difficulty sleeping and taking higher doses of melatonin, her husband thought this was the caused and stopped melatonin which seemed to help for a while. She started having noticeable difficulties again on 07/21/2018 with "unusual talk." She would not recognize her husband, asking her husband or neighbor to look for her husband when he was standing right in front of her. She does not realize she is at home and wants to put her shoes on to go home. Today her husband went for bloodwork and he left her in the car, when he came back she was convinced they left her father (who is deceased) in the doctor's office. She states she sees and hears her father or family member in the living room, this tends to get worse in the evening. She would get more combative when her husband tries to convince her otherwise. This was initially sporadic in January, but for the past 1-2 weeks has been a daily issue. She was started on Depakote 250mg  daily for behavioral changes last Saturday, no side effects. Her husband reports that she has always had walking difficulties for several years, balance has been worsening the past 2 years, but now forgets that she needs assistance to ambulate and would stand up and fall. Her husband is mostly concerned because these changes seem quite sudden. He has noticed that she is having more difficulty feeding herself with her right hand, with the fork ending up at her chin. She is tired all the time. Her husband denies any staring/unresponsive episodes, but he can tell it is not registering where she is or what is going on. Her husband has been managing medications and finances for the past 25 years. She stopped driving many years ago due to her eyesight/peripheral vision issues. Sleep is good.   She has infrequent headaches. She has occasional spinning when she stands up. She has had some horizontal  diplopia since the neuroma. She has neck and back pain. There is occasional tingling in both feet. She has bowel and bladder incontinence and wears adult diapers. She has had tremors for several years affecting writing and using utensils. No anosmia. Her father had vascular dementia. No history of significant head injuries or alcohol use.   She had a head CT on 07/22/2018 with stable right VP shunt, ventricles mildly more prominent compared to Jan 2012, however the sulci are also mildly more prominent. Increased ventricular prominence is favored to be due to interval volume loss/atrophy rather than shunt malfuncation, increasing white matter changes. Images were reviewed by his neurosurgeon Dr. Salomon Fick, imaging was stable from prior and follow-up MRI was recommended for 2026. Her last MRI brain was in 05/2016 reporting "Similar size and configuration the ventricular system with unchanged right frontal approach ventriculostomy catheter, with tip in the frontal horn of the right lateral ventricle. Similar size of enhancing left CPA angle mass which measures approximately 21 x 16 mm (previously 23 x 17 mm), compatible with a vestibular schwannoma. As previously demonstrated, this lesion widens the porus acousticus, indents the middle cerebellar peduncle, and  closely approximates the cisternal portion of the left 5th cranial nerve. Similar extensive T2 FLAIR signal in the periventricular and subcortical white matter, which is nonspecific, but most commonly reflect the sequela of chronic ischemic microvascular disease. Grossly normal flow-related signal in the major intracranial arteries and dural sinuses."   Observations/Objective:   Vitals:   10/13/18 1042  BP: 138/74  Temp: 98.1 F (36.7 C)  TempSrc: Temporal  Weight: 145 lb (65.8 kg)  Height: 4\' 11"  (1.499 m)   GEN:  The patient appears stated age and is in NAD.  Neurological examination:  Orientation: The patient is awake, states she is 47  initially, then corrects to 72. States year is "[birth year]."  Cranial nerves: There is good facial symmetry. 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  The speech is slightly slurred. No tongue deviation. Hearing is intact to conversational tone. Motor: Strength is at least antigravity x 4.   There is no pronator drift.  Movement examination: Tone: unable Abnormal movements: There is note of a tremor on fingers of left hand, she is bradykinetic (husband reports this is better than a few days ago) Coordination:  There is decremation with RAM's, with opening and closing of hands, finger taps Gait and Station: Not tested, patient was quite ataxic on last visit, needing 2-person assist  Assessment and Plan: This is a 73 year old right-handed woman with a history of  hypertension, hyperlipidemia, left acoustic neuroma s/p gamma knife in 2005, shunt placement in 2004, who presented for confusion that appeared to start rather acutely last January 2020. [place] 12/30 last month. She was having more behavioral changes/hallucinations and was advised to increase Depakote. This seemed to cause more issues which are slowly improving per husband, last dose of Depakote was last week. She is now also off Donepezil. She is more bradykinetic today, appears more parkinsonian on video. Discussed proceeding with MRI brain once able. We have agreed to hold off on starting medication for now, she will be reassessed in a week. Continue 24/7 supervision.   Follow Up Instructions:   -I discussed the assessment and treatment plan with the patient. The patient was provided an opportunity to ask questions and all were answered. The patient agreed with the plan and demonstrated an understanding of the instructions.   The patient was advised to call back or seek an in-person evaluation if the symptoms worsen or if the condition fails to improve as anticipated.    Total Time spent in visit with the patient was 25 minutes, of which more than 50% of the  time was spent in counseling and/or coordinating care on the above.   Pt understands and agrees with the plan of care outlined.     Cameron Sprang, MD

## 2018-10-19 ENCOUNTER — Telehealth: Payer: Self-pay | Admitting: *Deleted

## 2018-10-19 NOTE — Telephone Encounter (Signed)
-----   Message from Cameron Sprang, MD sent at 10/11/2018 12:50 PM EDT ----- Regarding: CT Pls let Mr. Tuma know that Alessa's scan did not show any change from her brain scan in January. There is no evidence of bleed, obvious stroke, although MRI would be more sensitive for stroke. How is she feeling off the medications? Is she less drowsy? Thanks

## 2018-10-19 NOTE — Telephone Encounter (Signed)
Appointment on Monday

## 2018-10-24 ENCOUNTER — Other Ambulatory Visit: Payer: Self-pay

## 2018-10-24 ENCOUNTER — Telehealth (INDEPENDENT_AMBULATORY_CARE_PROVIDER_SITE_OTHER): Payer: Medicare Other | Admitting: Neurology

## 2018-10-24 VITALS — BP 139/88

## 2018-10-24 DIAGNOSIS — R4189 Other symptoms and signs involving cognitive functions and awareness: Secondary | ICD-10-CM

## 2018-10-24 DIAGNOSIS — R4182 Altered mental status, unspecified: Secondary | ICD-10-CM

## 2018-10-24 DIAGNOSIS — R4689 Other symptoms and signs involving appearance and behavior: Secondary | ICD-10-CM

## 2018-10-24 MED ORDER — QUETIAPINE FUMARATE 25 MG PO TABS: ORAL_TABLET | ORAL | 3 refills | Status: DC

## 2018-10-24 NOTE — Progress Notes (Signed)
Virtual Visit via Video Note The purpose of this virtual visit is to provide medical care while limiting exposure to the novel coronavirus.    Consent was obtained for video visit:  Yes.   Answered questions that patient had about telehealth interaction:  Yes.   I discussed the limitations, risks, security and privacy concerns of performing an evaluation and management service by telemedicine. I also discussed with the patient that there may be a patient responsible charge related to this service. The patient expressed understanding and agreed to proceed.  Pt location: Home Physician Location: office Name of referring provider:  Prince Solian, MD I connected with Teliah Buffalo Marcel at patients initiation/request on 10/24/2018 at  1:30 PM EDT by video enabled telemedicine application and verified that I am speaking with the correct person using two identifiers. Pt MRN:  [id number] Pt DOB:  1945/10/17 Video Participants:  Kristine Cook;  Garrel Ridgel (husband)   History of Present Illness: The patient was last seen a week ago for worsening symptoms with medication changes. Her husband is present during this e-visit. She was seen for sudden change in cognition/behavior that started in January 2020, head CT in January 2020 and April 2020 did not show any acute changes, ventricles were mildly more prominent, but felt due to interval volume loss/atrophy rather than shunt malfunction. She had significant motor issues with Depakote (and possibly Donepezil), she has been off medications now for 2 weeks and is definitely improving physically. She is now able to feed herself (previously unable to lift fork or grab cup). She is able to stand better (but still needing support), able to lock her knees now (previously would be unable to stand). She did fall when she tried to get up by herself. Her husband reports that as she is improving physically, she is not so good mentally. She is more confused and agitated  from time to time, hallucinating. Yesterday she wanted to get the phone book to call Trilby Drummer, and despite repeated explanations and showing his driver's license, she would not be convinced that he was not there. She got more irritated and agitated as he tried to convince her, until he gave her the phone book and she called their home number. Confusion does not necessarily occur only in the evening hours. Sleep is good. She is not drowsy during the day anymore. He is worried that her diastolic BP has been higher than usual since starting Depakote and has remained the same even off Depakote for 2 weeks. BP today is 139/88.  History on Initial Assessment 09/02/2018:This is a pleasant 73 year old right-handed woman with a history of hypertension, hyperlipidemia, left acoustic neuroma s/p gamma knife in 2005, shunt placement in 2004, presenting for evaluation of confusion. She states she is 30. When asked about memory, she states "it worries me, I don't remember my children's names sometimes." Her husband started noticing minor memory changes when she retired in 2009, she has always confused names of distant family members, but symptoms significantly changed in April 2019. She and her husband went on a trip to [place], and he noted she was repeating herself, constantly asking the same thing. She still thought she was in [place] when they got back. She had previously been having difficulty sleeping and taking higher doses of melatonin, her husband thought this was the caused and stopped melatonin which seemed to help for a while. She started having noticeable difficulties again on 07/21/2018 with "unusual talk." She would not recognize her  husband, asking her husband or neighbor to look for her husband when he was standing right in front of her. She does not realize she is at home and wants to put her shoes on to go home. Today her husband went for bloodwork and he left her in the car, when he came back she was  convinced they left her father (who is deceased) in the doctor's office. She states she sees and hears her father or family member in the living room, this tends to get worse in the evening. She would get more combative when her husband tries to convince her otherwise. This was initially sporadic in January, but for the past 1-2 weeks has been a daily issue. She was started on Depakote 250mg  daily for behavioral changes last Saturday, no side effects. Her husband reports that she has always had walking difficulties for several years, balance has been worsening the past 2 years, but now forgets that she needs assistance to ambulate and would stand up and fall. Her husband is mostly concerned because these changes seem quite sudden. He has noticed that she is having more difficulty feeding herself with her right hand, with the fork ending up at her chin. She is tired all the time. Her husband denies any staring/unresponsive episodes, but he can tell it is not registering where she is or what is going on. Her husband has been managing medications and finances for the past 25 years. She stopped driving many years ago due to her eyesight/peripheral vision issues. Sleep is good.   She has infrequent headaches. She has occasional spinning when she stands up. She has had some horizontal diplopia since the neuroma. She has neck and back pain. There is occasional tingling in both feet. She has bowel and bladder incontinence and wears adult diapers. She has had tremors for several years affecting writing and using utensils. No anosmia. Her father had vascular dementia. No history of significant head injuries or alcohol use.   She had a head CT on 07/22/2018 with stable right VP shunt, ventricles mildly more prominent compared to Jan 2012, however the sulci are also mildly more prominent. Increased ventricular prominence is favored to be due to interval volume loss/atrophy rather than shunt malfuncation, increasing white  matter changes. Images were reviewed by his neurosurgeon Dr. Salomon Fick, imaging was stable from prior and follow-up MRI was recommended for 2026. Her last MRI brain was in 05/2016 reporting "Similar size and configuration the ventricular system with unchanged right frontal approach ventriculostomy catheter, with tip in the frontal horn of the right lateral ventricle. Similar size of enhancing left CPA angle mass which measures approximately 21 x 16 mm (previously 23 x 17 mm), compatible with a vestibular schwannoma. As previously demonstrated, this lesion widens the porus acousticus, indents the middle cerebellar peduncle, and closely approximates the cisternal portion of the left 5th cranial nerve. Similar extensive T2 FLAIR signal in the periventricular and subcortical white matter, which is nonspecific, but most commonly reflect the sequela of chronic ischemic microvascular disease. Grossly normal flow-related signal in the major intracranial arteries and dural sinuses."   Observations/Objective:   Vitals:   10/24/18 1328  BP: 139/88   GEN:  The patient appears stated age and is in NAD.  Neurological examination:  Orientation: The patient is awake, more alert during today's e-visit. She gives correct month, states year is "70," day is Thursday (it is Monday).  Cranial nerves: There is good facial symmetry. There is facial hypomimia and left facial  asymmetry (similar to prior).  The speech is slightly slurred. No tongue deviation. Hearing is intact to conversational tone. Motor: Strength is at least antigravity x 4.  She is again noted to orbit around the left arm, indicating mild left UE weakness. There is no pronator drift.  Movement examination: Tone: unable Abnormal movements: There is again note of a tremor on fingers of both hands. She appears less bradykinetic today, with better finger taps. Still decremation with opening and closing of hands.  Gait and Station: Not tested, patient was quite  ataxic on last visit, needing 2-person assist  Assessment and Plan: This is a 73 year old right-handed woman with a history of  hypertension, hyperlipidemia, left acoustic neuroma s/p gamma knife in 2005, shunt placement in 2004, who presented for confusion that appeared to start rather acutely last January 2020. [place] 12/30 in March 2020. She had significant side effects with Depakote (and possibly Donepezil). She has been off medications for the past 2 weeks with noted improvement physically but worsening cognitive/behavioral issues off medications. She is more alert today, the parkinsonism appears less on video today, she is less bradykinetic but still noted to have mild left-sided weakness. MRI brain would be helpful once able. We discussed medications for behavioral changes, her husband is agreeable to starting with prn low dose Seroquel 12.5mg  in the evenings. If no side effects, we can increase to every 8 hours as needed. Side effects, including cardiac black box warning, were discussed. She appears to be quite sensitive to medications, would start at lowest dose possible. Continue 24/7 care, follow-up in 1 month. Her husband knows to call for any changes.   Follow Up Instructions:   -I discussed the assessment and treatment plan with the patient/husband. The patient/husband were provided an opportunity to ask questions and all were answered. The patient/husband agreed with the plan and demonstrated an understanding of the instructions.   The patient/husband was advised to call back or seek an in-person evaluation if the symptoms worsen or if the condition fails to improve as anticipated.    Total Time spent in visit with the patient was 30 minutes, of which more than 50% of the time was spent in counseling and/or coordinating care on the above.   Pt/husband understand and agree with the plan of care outlined.     Cameron Sprang, MD

## 2018-11-04 ENCOUNTER — Ambulatory Visit: Payer: Medicare Other | Admitting: Neurology

## 2018-11-16 ENCOUNTER — Other Ambulatory Visit: Payer: Self-pay | Admitting: Neurology

## 2018-11-23 ENCOUNTER — Telehealth: Payer: Medicare Other | Admitting: Neurology

## 2018-11-24 ENCOUNTER — Telehealth: Payer: Self-pay | Admitting: Neurology

## 2018-11-24 NOTE — Telephone Encounter (Signed)
Husband calling in with update before appt tom:  He said about the Seroquel medication they completed that but on the 8th day for week #3 of taking the 3 1/2 pills, she started to go down hill and he called PCP and Dr. Selena Lesser at [facility] and he advised her to stop taking it.  Trying to get MRI at [facility] and has yet to get that done because of their scheduling, wanted to know if you can have a voice to go ahead and get that done faster.   Just FYI Thanks!

## 2018-11-25 ENCOUNTER — Other Ambulatory Visit: Payer: Self-pay

## 2018-11-25 ENCOUNTER — Encounter: Payer: Self-pay | Admitting: Neurology

## 2018-11-25 ENCOUNTER — Telehealth (INDEPENDENT_AMBULATORY_CARE_PROVIDER_SITE_OTHER): Payer: Medicare Other | Admitting: Neurology

## 2018-11-25 VITALS — BP 125/76 | Ht 59.0 in | Wt 145.0 lb

## 2018-11-25 DIAGNOSIS — R531 Weakness: Secondary | ICD-10-CM

## 2018-11-25 DIAGNOSIS — R4182 Altered mental status, unspecified: Secondary | ICD-10-CM

## 2018-11-25 DIAGNOSIS — R4689 Other symptoms and signs involving appearance and behavior: Secondary | ICD-10-CM

## 2018-11-25 DIAGNOSIS — R413 Other amnesia: Secondary | ICD-10-CM

## 2018-11-25 DIAGNOSIS — R4189 Other symptoms and signs involving cognitive functions and awareness: Secondary | ICD-10-CM

## 2018-11-25 MED ORDER — ARIPIPRAZOLE 2 MG PO TABS: ORAL_TABLET | ORAL | 5 refills | Status: DC

## 2018-11-25 NOTE — Progress Notes (Signed)
Virtual Visit via Video/Phone Note The purpose of this virtual visit is to provide medical care while limiting exposure to the novel coronavirus.  Patient was examined on video, however due to technical difficulties, majority of visit was via phone.  Consent was obtained for video visit:  Yes.   Answered questions that patient had about telehealth interaction:  Yes.   I discussed the limitations, risks, security and privacy concerns of performing an evaluation and management service by telemedicine. I also discussed with the patient that there may be a patient responsible charge related to this service. The patient expressed understanding and agreed to proceed.  Pt location: Home Physician Location: office Name of referring provider:  Prince Solian, MD I connected with Kristine Cook at patients initiation/request on 11/25/2018 at  1:00 PM EDT by video enabled telemedicine application and verified that I am speaking with the correct person using two identifiers. Pt MRN:  [id number] Pt DOB:  Feb 15, 1946 Video Participants:  Tonia Brooms;  Garrel Ridgel   History of Present Illness:  The patient was last seen a month ago for altered mental status that appears to have started rather acutely in January 2020. She has been having confusion with delusions and hallucinations. She has been unable to have the MRI brain due to pandemic restrictions. She has been quite sensitive to medications for behavioral changes, she has tried Depakote and recently Seroquel. She started a very low dose of Seroquel 12.5mg  qhs which she tolerated for a week, tolerated 25mg  daily dose for a week, however her husband reports that when she was on 37.5mg  daily dose, she again became extremely lethargic, talking in a whisper, with no balance, unable to hold her head up, with urinary incontinence to the point she did not even know she had urinated. He called her PCP office and was instructed to stop it last Monday, then he  reports "everything got better the next day," she got a good night's sleep and looked 100% better the next morning. Of note, she had been having sleeping difficulties and was up all night Sunday. Aside from side effects, he did not notice any behavioral improvement on low dose Seroquel, she had a bad week last week with several delusional days where she would wake up in a disagreeable disposition and would be very hard to convince that she was at home. This would last all day, then she seems to clear up and be pleasant again, with no memory of these belligerent periods. He reports that over the past 4 days off Seroquel, she is pretty alert, but still more confused in the evening hours. She is able to feed herself and eats well, he does not notice any focal weakness. Balance is still off, but she can walk with assistance to the bathroom, which she was unable to do at certain periods on the sedating medications. He feels her leg strength is better.   History on Initial Assessment 09/02/2018:This is a pleasant 73 year old right-handed woman with a history of hypertension, hyperlipidemia, left acoustic neuroma s/p gamma knife in 2005, shunt placement in 2004, presenting for evaluation of confusion. She states she is 24. When asked about memory, she states "it worries me, I don't remember my children's names sometimes." Her husband started noticing minor memory changes when she retired in 2009, she has always confused names of distant family members, but symptoms significantly changed in April 2019. She and her husband went on a trip to [place], and he noted she  was repeating herself, constantly asking the same thing. She still thought she was in [place] when they got back. She had previously been having difficulty sleeping and taking higher doses of melatonin, her husband thought this was the caused and stopped melatonin which seemed to help for a while. She started having noticeable difficulties again on  07/21/2018 with "unusual talk." She would not recognize her husband, asking her husband or neighbor to look for her husband when he was standing right in front of her. She does not realize she is at home and wants to put her shoes on to go home. Today her husband went for bloodwork and he left her in the car, when he came back she was convinced they left her father (who is deceased) in the doctor's office. She states she sees and hears her father or family member in the living room, this tends to get worse in the evening. She would get more combative when her husband tries to convince her otherwise. This was initially sporadic in January, but for the past 1-2 weeks has been a daily issue. She was started on Depakote 250mg  daily for behavioral changes last Saturday, no side effects. Her husband reports that she has always had walking difficulties for several years, balance has been worsening the past 2 years, but now forgets that she needs assistance to ambulate and would stand up and fall. Her husband is mostly concerned because these changes seem quite sudden. He has noticed that she is having more difficulty feeding herself with her right hand, with the fork ending up at her chin. She is tired all the time. Her husband denies any staring/unresponsive episodes, but he can tell it is not registering where she is or what is going on. Her husband has been managing medications and finances for the past 25 years. She stopped driving many years ago due to her eyesight/peripheral vision issues. Sleep is good.   She has infrequent headaches. She has occasional spinning when she stands up. She has had some horizontal diplopia since the neuroma. She has neck and back pain. There is occasional tingling in both feet. She has bowel and bladder incontinence and wears adult diapers. She has had tremors for several years affecting writing and using utensils. No anosmia. Her father had vascular dementia. No history of significant  head injuries or alcohol use.   She had a head CT on 07/22/2018 with stable right VP shunt, ventricles mildly more prominent compared to Jan 2012, however the sulci are also mildly more prominent. Increased ventricular prominence is favored to be due to interval volume loss/atrophy rather than shunt malfuncation, increasing white matter changes. Images were reviewed by his neurosurgeon Dr. Salomon Fick, imaging was stable from prior and follow-up MRI was recommended for 2026. Her last MRI brain was in 05/2016 reporting "Similar size and configuration the ventricular system with unchanged right frontal approach ventriculostomy catheter, with tip in the frontal horn of the right lateral ventricle. Similar size of enhancing left CPA angle mass which measures approximately 21 x 16 mm (previously 23 x 17 mm), compatible with a vestibular schwannoma. As previously demonstrated, this lesion widens the porus acousticus, indents the middle cerebellar peduncle, and closely approximates the cisternal portion of the left 5th cranial nerve. Similar extensive T2 FLAIR signal in the periventricular and subcortical white matter, which is nonspecific, but most commonly reflect the sequela of chronic ischemic microvascular disease. Grossly normal flow-related signal in the major intracranial arteries and dural sinuses."  Current Outpatient Medications on File Prior to Visit  Medication Sig Dispense Refill   ALPRAZolam (XANAX) 1 MG tablet Take 1 mg by mouth daily.      aspirin 81 MG tablet Take 81 mg by mouth daily.     clidinium-chlordiazePOXIDE (LIBRAX) 2.5-5 MG per capsule Take 1 capsule by mouth Three times a day.      CRESTOR 5 MG tablet Take 5 mg by mouth daily.      desloratadine (CLARINEX) 5 MG tablet Take 5 mg by mouth daily as needed (allergies).      DILTIAZEM HCL CD 360 MG 24 hr capsule Take 360 mg by mouth daily.      estradiol (ESTRACE) 1 MG tablet Take 1 mg by mouth daily.     FeAsp-FeFum  -Suc-C-Thre-B12-FA (MULTIGEN PLUS) 50-101-1 MG TABS Take 1 tablet by mouth daily.      Flaxseed, Linseed, (RA FLAX SEED OIL 1000 PO) Take 1 capsule by mouth daily.      FLUoxetine (PROZAC) 20 MG capsule Take 20 mg by mouth 3 (three) times daily.      folic acid (FOLVITE) 1 MG tablet Take 1 mg by mouth daily.      Lactobacillus-Inulin ([place] PO) Take by mouth.     levothyroxine (SYNTHROID, LEVOTHROID) 50 MCG tablet Take 50 mcg by mouth daily.  11   mesalamine (LIALDA) 1.2 g EC tablet Take by mouth daily with breakfast. 4 tabs daily     metoCLOPramide (REGLAN) 5 MG tablet Take 2.5 mg by mouth daily.      MICARDIS 80 MG tablet Take 80 mg by mouth daily.      Multiple Vitamins-Minerals (CENTRUM SILVER PO) Take by mouth.     Omega-3 Fatty Acids (FISH OIL) 1200 MG CAPS Take 1 capsule by mouth 3 (three) times daily.      ondansetron (ZOFRAN) 4 MG tablet Take 4 mg by mouth every 8 (eight) hours as needed for nausea.      pantoprazole (PROTONIX) 40 MG tablet Take 40 mg by mouth 2 (two) times daily.      spironolactone (ALDACTONE) 25 MG tablet Take 12.5 mg by mouth daily.      QUEtiapine (SEROQUEL) 25 MG tablet Take 1/2 tablet in the evening as needed for agitation. If no side effects after a week, may take 1/2 tablet every 8 hours as needed for agitation (Patient not taking: Reported on 11/25/2018) 45 tablet 3   No current facility-administered medications on file prior to visit.      Observations/Objective:   Vitals:   11/25/18 1249  BP: 125/76  Weight: 145 lb (65.8 kg)  Height: 4\' 11"  (1.499 m)   GEN:  The patient appears stated age and is in NAD.  Neurological examination: She is awake, alert, follows commands.  Cranial nerves: There is left facial asymmetry (similar to prior).  The speech is slightly slurred. No tongue deviation. Hearing is intact to conversational tone. Motor: Strength is at least antigravity x 4.  She is again noted to orbit around the  left arm, indicating mild left UE weakness. There is no pronator drift.  Abnormal movements: No clear tremor today. She again appears slightly more bradykinetic with decreased finger taps and rapid alternating movements.  Gait and Station: Not tested, patient was quite ataxic on last visit, needing 2-person assist  Assessment and Plan: This is a 73 year old right-handed woman with a history of  hypertension, hyperlipidemia, left acoustic neuroma s/p gamma knife in  2005, shunt placement in 2004, who presented for confusion that appeared to start rather acutely last January 2020. [place] 12/30 in March 2020. She continues to have periods of delusions and hallucinations, in addition to confusion. Proceed with MRI brain with and without contrast. She has been very sensitive to medication side effects, but husband is willing to try another medication to help with behavioral changes. We agreed to start a very low dose of Abilify 2mg  1/2 tab qhs x 2 weeks, then increase to 1 tab qhs. Side effects discussed. Continue 24/7 care, follow-up in 1 month. Her husband knows to call for any changes.    Follow Up Instructions:   -I discussed the assessment and treatment plan with the patient. The patient was provided an opportunity to ask questions and all were answered. The patient agreed with the plan and demonstrated an understanding of the instructions.   The patient was advised to call back or seek an in-person evaluation if the symptoms worsen or if the condition fails to improve as anticipated.    Total Time spent in visit with the patient was:  25 minutes, of which more than 50% of the time was spent in counseling and/or coordinating care on the above.   Pt understands and agrees with the plan of care outlined.     Cameron Sprang, MD

## 2018-11-25 NOTE — Telephone Encounter (Signed)
Noted  

## 2018-11-28 ENCOUNTER — Other Ambulatory Visit: Payer: Self-pay

## 2018-11-28 DIAGNOSIS — R531 Weakness: Secondary | ICD-10-CM

## 2018-11-29 ENCOUNTER — Telehealth: Payer: Self-pay | Admitting: Neurology

## 2018-11-29 NOTE — Telephone Encounter (Signed)
Pt husband called informed that we have talked to MRI center in [place] we need to have the order signed 1st and ill fax it to them tomorrow and it also has for them to schedule her to see Dr. Salomon Fick after she has the MRI to have her shunt reprogrammed

## 2018-11-29 NOTE — Telephone Encounter (Signed)
Pt cant do the MRI at [facility] and needs the MRI center in [place] please fax the orders to [phone]

## 2018-11-30 ENCOUNTER — Telehealth: Payer: Self-pay | Admitting: Neurology

## 2018-11-30 NOTE — Telephone Encounter (Signed)
Called [facility] back they asked who with insurance we spoke with and the insurance reference # because pt does not need a PA done

## 2018-11-30 NOTE — Telephone Encounter (Signed)
Kencie at [facility] called asking for an insurance reference # for pt's MRI brain. Pls call her with this information.

## 2018-12-01 NOTE — Telephone Encounter (Signed)
Kencie at [facility] called again stating that they do need either a name or insurance reference number. Pls call her.

## 2018-12-01 NOTE — Progress Notes (Signed)
reference# [id number] from [facility] for MRI to be done at [facility]

## 2018-12-17 ENCOUNTER — Other Ambulatory Visit: Payer: Self-pay | Admitting: Neurology

## 2019-01-02 ENCOUNTER — Telehealth: Payer: Self-pay | Admitting: Neurology

## 2019-01-02 NOTE — Telephone Encounter (Signed)
MRI results from [facility] done 12/27/2018:  NO acute changes. Similar appearance of ventricular system with unchanged right frontal approach VP shunt. Extensive chronic microvascular disease. Interval decrease in size of enhancing left CPA and IAC mass since MRI in 2017, with imaging characteristics most compatible with a vestibular schwannoma

## 2019-01-03 ENCOUNTER — Telehealth: Payer: Self-pay

## 2019-01-03 NOTE — Telephone Encounter (Signed)
-----   Message from Cameron Sprang, MD sent at 01/02/2019  4:08 PM EDT ----- Regarding: MRI results Pls let her husband know that the MRI brain did not show any evidence of shunt abnormality or infection/inflammation. There was no evidence of stroke or bleed. How is she doing? Thanks!

## 2019-01-03 NOTE — Telephone Encounter (Signed)
Left message for husband, Trilby Drummer to return call.

## 2019-01-04 NOTE — Telephone Encounter (Signed)
Pt scheduled for 01/10/19 at 10

## 2019-01-04 NOTE — Telephone Encounter (Signed)
Not sure about which therapy he means, we can discuss on her f/u, thanks

## 2019-01-04 NOTE — Telephone Encounter (Signed)
Husband has gradually increased pt back up to Abilify 2mg  qhs.   Pt does pretty well during the day. Only some tired. However, she has a pattern every evening around 5. Pt becomes agitated, argumentative and confused.   Husband also concerned about recent MRI that was performed at [facility] across from [facility] on June 30th. He also wants to schedule an appt with Dr. Delice Lesch.

## 2019-01-04 NOTE — Telephone Encounter (Signed)
His concerns about the MRI were that something could possible be reversed? Something about RAS therapy.

## 2019-01-04 NOTE — Telephone Encounter (Signed)
Did he listen to his voicemail about the MRI results, what are his concerns about the MRI. Does he want to try giving the Abilify at around 5pm when she gets more agitated. Ok to put her in for video visit on Tues at 10am, thanks!

## 2019-01-10 ENCOUNTER — Encounter: Payer: Self-pay | Admitting: Neurology

## 2019-01-10 ENCOUNTER — Other Ambulatory Visit: Payer: Self-pay

## 2019-01-10 ENCOUNTER — Telehealth (INDEPENDENT_AMBULATORY_CARE_PROVIDER_SITE_OTHER): Payer: Medicare Other | Admitting: Neurology

## 2019-01-10 VITALS — BP 109/55 | HR 54 | Temp 97.8°F | Ht 59.0 in | Wt 145.0 lb

## 2019-01-10 DIAGNOSIS — R4189 Other symptoms and signs involving cognitive functions and awareness: Secondary | ICD-10-CM

## 2019-01-10 DIAGNOSIS — R4689 Other symptoms and signs involving appearance and behavior: Secondary | ICD-10-CM

## 2019-01-10 DIAGNOSIS — F03B18 Unspecified dementia, moderate, with other behavioral disturbance: Secondary | ICD-10-CM

## 2019-01-10 DIAGNOSIS — R4182 Altered mental status, unspecified: Secondary | ICD-10-CM

## 2019-01-10 DIAGNOSIS — F22 Delusional disorders: Secondary | ICD-10-CM

## 2019-01-10 DIAGNOSIS — G20 Parkinson's disease: Secondary | ICD-10-CM

## 2019-01-10 DIAGNOSIS — R443 Hallucinations, unspecified: Secondary | ICD-10-CM

## 2019-01-10 DIAGNOSIS — F0391 Unspecified dementia with behavioral disturbance: Secondary | ICD-10-CM

## 2019-01-10 MED ORDER — CARBIDOPA-LEVODOPA 25-100 MG PO TABS: ORAL_TABLET | ORAL | 5 refills | Status: DC

## 2019-01-10 MED ORDER — ARIPIPRAZOLE 2 MG PO TABS: ORAL_TABLET | ORAL | 3 refills | Status: DC

## 2019-01-10 NOTE — Progress Notes (Signed)
Virtual Visit via Video/Phone Note The purpose of this virtual visit is to provide medical care while limiting exposure to the novel coronavirus.  Patient was examined on video, however due to technical difficulties, majority of visit was via phone.  Consent was obtained for video visit:  Yes.   Answered questions that patient had about telehealth interaction:  Yes.   I discussed the limitations, risks, security and privacy concerns of performing an evaluation and management service by telemedicine. I also discussed with the patient that there may be a patient responsible charge related to this service. The patient expressed understanding and agreed to proceed.  Pt location: Home Physician Location: office Name of referring provider:  Prince Solian, MD I connected with Kristine Cook at patients initiation/request on 01/10/2019 at 10:00 AM EDT by video enabled telemedicine application and verified that I am speaking with the correct person using two identifiers. Pt MRN:  [id number] Pt DOB:  1945-08-03 Video Participants:  Tonia Brooms;  Garrel Ridgel   History of Present Illness:  The patient was seen as a virtual video visit on 01/10/2019. She was last seen around 6 weeks ago when she presented with altered mental status that appeared to have started rather acutely in January 2020. She has been having confusion with delusions and hallucinations. She finally had her MRI brain with and without contrast done last 12/27/2018 which did not show any acute changes. There was interval decrease in left CP angle mass, it widens the porus acousticus and closely approximates the cisternal portion of the left 5th cranial nerve, but now with less mass effect on the middle cerebellar peduncle. There was similar extensive FLAIR signal in the periventricular and subcortical white matter most commonly reflect the sequela of chronic ischemic microvascular disease. The ventriculostromy catheter  Tip was in the  frontal horn of the right lateral ventricle with no concerning findings. Her husband Trilby Drummer continues to deal with delusions that start at 5pm. She does relatively well during the day, then around 5pm gets more agitated, unreasonable, and confused. She does not think they are at home and Trilby Drummer would reason with her and show her proof. She would talk ugly to him or her children, then would have no recollection of it the next day. She used to want to watch TV, but now has very little interest and ready to go to bed at 9pm. She had side effects on Depakote and Seroquel, but appears to be tolerating low dose Abilify 2mg  qhs better, she has been on this dose for 2 weeks. She continues to have difficulty with her movements, she forgets she is right-handed and would grab the spoon with her left hand. Her husband has not noticed any change with getting around, not any better, her balance is bad and she leans to the right when sitting on the commode. No falls. Her hands tremble a lot, he has noticed it more with the right 5th digit. Sometimes her spoon bounces up and down from shaking so bad.   History on Initial Assessment 09/02/2018:This is a pleasant 73 year old right-handed woman with a history of hypertension, hyperlipidemia, left acoustic neuroma s/p gamma knife in 2005, shunt placement in 2004, presenting for evaluation of confusion. She states she is 56. When asked about memory, she states "it worries me, I don't remember my children's names sometimes." Her husband started noticing minor memory changes when she retired in 2009, she has always confused names of distant family members, but symptoms significantly  changed in April 2019. She and her husband went on a trip to [place], and he noted she was repeating herself, constantly asking the same thing. She still thought she was in [place] when they got back. She had previously been having difficulty sleeping and taking higher doses of melatonin, her husband  thought this was the caused and stopped melatonin which seemed to help for a while. She started having noticeable difficulties again on 07/21/2018 with "unusual talk." She would not recognize her husband, asking her husband or neighbor to look for her husband when he was standing right in front of her. She does not realize she is at home and wants to put her shoes on to go home. Today her husband went for bloodwork and he left her in the car, when he came back she was convinced they left her father (who is deceased) in the doctor's office. She states she sees and hears her father or family member in the living room, this tends to get worse in the evening. She would get more combative when her husband tries to convince her otherwise. This was initially sporadic in January, but for the past 1-2 weeks has been a daily issue. She was started on Depakote 250mg  daily for behavioral changes last Saturday, no side effects. Her husband reports that she has always had walking difficulties for several years, balance has been worsening the past 2 years, but now forgets that she needs assistance to ambulate and would stand up and fall. Her husband is mostly concerned because these changes seem quite sudden. He has noticed that she is having more difficulty feeding herself with her right hand, with the fork ending up at her chin. She is tired all the time. Her husband denies any staring/unresponsive episodes, but he can tell it is not registering where she is or what is going on. Her husband has been managing medications and finances for the past 25 years. She stopped driving many years ago due to her eyesight/peripheral vision issues. Sleep is good.   She has infrequent headaches. She has occasional spinning when she stands up. She has had some horizontal diplopia since the neuroma. She has neck and back pain. There is occasional tingling in both feet. She has bowel and bladder incontinence and wears adult diapers. She has had  tremors for several years affecting writing and using utensils. No anosmia. Her father had vascular dementia. No history of significant head injuries or alcohol use.   She had a head CT on 07/22/2018 with stable right VP shunt, ventricles mildly more prominent compared to Jan 2012, however the sulci are also mildly more prominent. Increased ventricular prominence is favored to be due to interval volume loss/atrophy rather than shunt malfuncation, increasing white matter changes. Images were reviewed by his neurosurgeon Dr. Salomon Fick, imaging was stable from prior and follow-up MRI was recommended for 2026. Her last MRI brain was in 05/2016 reporting "Similar size and configuration the ventricular system with unchanged right frontal approach ventriculostomy catheter, with tip in the frontal horn of the right lateral ventricle. Similar size of enhancing left CPA angle mass which measures approximately 21 x 16 mm (previously 23 x 17 mm), compatible with a vestibular schwannoma. As previously demonstrated, this lesion widens the porus acousticus, indents the middle cerebellar peduncle, and closely approximates the cisternal portion of the left 5th cranial nerve. Similar extensive T2 FLAIR signal in the periventricular and subcortical white matter, which is nonspecific, but most commonly reflect the sequela of chronic ischemic  microvascular disease. Grossly normal flow-related signal in the major intracranial arteries and dural sinuses."     Current Outpatient Medications on File Prior to Visit  Medication Sig Dispense Refill   ALPRAZolam (XANAX) 1 MG tablet Take 1 mg by mouth daily.      ARIPiprazole (ABILIFY) 2 MG tablet TAKE 1/2 TABLET BY MOUTH EVERY NIGHT FOR 2 WEEKS THEN INCREASE TO 1 TABLET EVERY NIGHT (Patient taking differently: Take 2 mg by mouth at bedtime. ) 90 tablet 2   aspirin 81 MG tablet Take 81 mg by mouth daily.     clidinium-chlordiazePOXIDE (LIBRAX) 2.5-5 MG per capsule Take 1 capsule by  mouth Three times a day.      CRESTOR 5 MG tablet Take 5 mg by mouth daily.      desloratadine (CLARINEX) 5 MG tablet Take 5 mg by mouth daily as needed (allergies).      DILTIAZEM HCL CD 360 MG 24 hr capsule Take 360 mg by mouth daily.      estradiol (ESTRACE) 1 MG tablet Take 1 mg by mouth daily.     FeAsp-FeFum -Suc-C-Thre-B12-FA (MULTIGEN PLUS) 50-101-1 MG TABS Take 1 tablet by mouth daily.      Flaxseed, Linseed, (RA FLAX SEED OIL 1000 PO) Take 1 capsule by mouth daily.      FLUoxetine (PROZAC) 20 MG capsule Take 20 mg by mouth 3 (three) times daily.      folic acid (FOLVITE) 1 MG tablet Take 1 mg by mouth daily.      Lactobacillus-Inulin ([place] PO) Take by mouth.     levothyroxine (SYNTHROID, LEVOTHROID) 50 MCG tablet Take 50 mcg by mouth daily.  11   mesalamine (LIALDA) 1.2 g EC tablet Take by mouth daily with breakfast. 4 tabs daily     metoCLOPramide (REGLAN) 5 MG tablet Take 2.5 mg by mouth daily.      MICARDIS 80 MG tablet Take 80 mg by mouth daily.      Multiple Vitamins-Minerals (CENTRUM SILVER PO) Take by mouth.     Omega-3 Fatty Acids (FISH OIL) 1200 MG CAPS Take 1 capsule by mouth 3 (three) times daily.      ondansetron (ZOFRAN) 4 MG tablet Take 4 mg by mouth every 8 (eight) hours as needed for nausea.      pantoprazole (PROTONIX) 40 MG tablet Take 40 mg by mouth 2 (two) times daily.            spironolactone (ALDACTONE) 25 MG tablet Take 12.5 mg by mouth daily.      No current facility-administered medications on file prior to visit.      Observations/Objective:   Vitals:   01/10/19 0940  BP: (!) 109/55  Pulse: (!) 54  Temp: 97.8 F (36.6 C)  Weight: 145 lb (65.8 kg)  Height: 4\' 11"  (1.499 m)   GEN:  The patient appears stated age and is in NAD.  Neurological examination: She is awake, alert, follows commands.  Cranial nerves: There is left facial asymmetry (similar to prior).  Speech is slightly slurred, she is able to  answer questions, difficulty following som commands. Her husband notes that she does not understand instructions to roll her arms but later on she would be able to do it. Motor: Strength is at least antigravity x 4.  She has a resting tremor in the right 5th digit. She is bradykinetic and keeps her right hand fingers extended, decreased fine finger movements and rapid alternating movements. Gait not tested,  she was ataxic on last office visit needing 2-person assist.   Assessment and Plan: This is a 73 yo RH woman with a history of  hypertension, hyperlipidemia, left acoustic neuroma s/p gamma knife in 2005, shunt placement in 2004, who presented for confusion that appeared to start rather acutely last January 2020. [place] 12/30 in March 2020. MRI brain no acute changes, no shunt abnormalities. There is note of extensive chronic ischemic microvascular disease. She is again noted to be bradykinetic today with right hand resting tremor, we discussed a trial of low dose Sinemet 25/100mg  1/2 tab before meals. Her husband continues to have difficulties with sundowning and will increase Abilify to 3mg  given at 5pm when she starts having more behavioral changes. Referral will be sent to Geriatric Psychiatry to assist with increasing behavioral changes due to dementia. Husband is asking about help when she is refusing medications, he already crushes medications in food. Continue 24/7 care, follow-up in 1 month. Her husband knows to call for any changes.   Follow Up Instructions:   -I discussed the assessment and treatment plan with the patient. The patient was provided an opportunity to ask questions and all were answered. The patient agreed with the plan and demonstrated an understanding of the instructions.   The patient was advised to call back or seek an in-person evaluation if the symptoms worsen or if the condition fails to improve as anticipated.    Cameron Sprang, MD

## 2019-02-03 ENCOUNTER — Telehealth: Payer: Self-pay | Admitting: Neurology

## 2019-02-03 MED ORDER — ARIPIPRAZOLE 2 MG PO TABS: ORAL_TABLET | ORAL | 1 refills | Status: DC

## 2019-02-03 NOTE — Telephone Encounter (Signed)
We can continue increasing the Abilify, we have been doing it very slowly because she has been sensitive to medication. If he is agreeable, increase Abilify 2mg : Take 2 tabs in evening, he can try giving it at 5pm instead of bedtime. Thanks for checking on Behavioral Health referral.

## 2019-02-03 NOTE — Telephone Encounter (Signed)
Called husband and informed him MD wants to increase Abilify to 4 mg every night at 5pm. New script sent in to pharmacy [facility] in [place]. Also [facility] referral coordinator [phone] had called husband and left a message to set up geriatric psychiatry referral. He has her number and will call her back to arrange.

## 2019-02-03 NOTE — Telephone Encounter (Signed)
Pt husband is wanting to talk to someone about behavioral problems she is having from the 5:00pm hour to bed time

## 2019-02-03 NOTE — Telephone Encounter (Signed)
Husband called stating that his wife is getting increasingly more confused at the 5pm hour. This was noted in the previous 7/14 virtual visit with MD but it is getting worse. She starts the day out "okay" per husband but every day around 5 very confused and is fixated on former problems/worries of her past that have been resolved. She is very anxious and states she wants to go home. When her husband asks her where her home is she states the address of where they currently are but she does not think she is at home. The next am she has no memory of the worries/anxieties of the previous evening. This happens every night.  Meds - she is taking the Abilify 3mg  QHS. Also the Sinement 25/100mg  1/2 tab before meals.   Husband was asking about the geriatric psychiatry consult discussed at last appt (ordered 7/14). I called and left voice mail with referral coordinator to make sure they have received the referral.   Husband was asking if there were a medication that patient might be able to take to help her get through the anxiety/worries every evening. Next in person appt with Dr. Delice Lesch is 8/28.

## 2019-02-06 ENCOUNTER — Telehealth: Payer: Self-pay | Admitting: Neurology

## 2019-02-06 NOTE — Telephone Encounter (Signed)
Husband is calling in about referral that was supposed to be sent to psychiatrist. Please call him back at [phone].  Thanks!

## 2019-02-07 NOTE — Telephone Encounter (Signed)
He is calling in again about referral. Thanks!

## 2019-02-08 NOTE — Telephone Encounter (Signed)
Spoke with pts husband.   He would like to know why Dr. Delice Lesch wants pt to see psychiatry, what is psychiatry going to do, are there any medications pt can start on instead of a psychiatry?  Informed husband that Psychiatry is the next step. Pts dx is worsening dementia with behavioral changes. Also hallucinations and delusions. Medications could possible make things worse so will hold those for now. Instructed husband to have the evaluation and if still concerned to call back. He understood.

## 2019-02-15 ENCOUNTER — Ambulatory Visit (HOSPITAL_COMMUNITY): Payer: Medicare Other | Admitting: Psychiatry

## 2019-02-15 ENCOUNTER — Other Ambulatory Visit: Payer: Self-pay

## 2019-02-24 ENCOUNTER — Encounter: Payer: Self-pay | Admitting: Neurology

## 2019-02-24 ENCOUNTER — Other Ambulatory Visit: Payer: Self-pay

## 2019-02-24 ENCOUNTER — Ambulatory Visit (INDEPENDENT_AMBULATORY_CARE_PROVIDER_SITE_OTHER): Payer: Medicare Other | Admitting: Neurology

## 2019-02-24 VITALS — BP 138/76 | HR 61 | Ht 59.0 in | Wt 145.0 lb

## 2019-02-24 DIAGNOSIS — F03B18 Unspecified dementia, moderate, with other behavioral disturbance: Secondary | ICD-10-CM

## 2019-02-24 DIAGNOSIS — R292 Abnormal reflex: Secondary | ICD-10-CM

## 2019-02-24 DIAGNOSIS — G20 Parkinson's disease: Secondary | ICD-10-CM

## 2019-02-24 DIAGNOSIS — F0391 Unspecified dementia with behavioral disturbance: Secondary | ICD-10-CM | POA: Diagnosis not present

## 2019-02-24 MED ORDER — CARBIDOPA-LEVODOPA 25-100 MG PO TABS: ORAL_TABLET | ORAL | 11 refills | Status: AC

## 2019-02-24 MED ORDER — DIVALPROEX SODIUM 125 MG PO CSDR: DELAYED_RELEASE_CAPSULE | ORAL | 11 refills | Status: AC

## 2019-02-24 MED ORDER — ARIPIPRAZOLE 5 MG PO TABS: ORAL_TABLET | ORAL | 11 refills | Status: DC

## 2019-02-24 NOTE — Progress Notes (Signed)
NEUROLOGY FOLLOW UP OFFICE NOTE  ELISABETTA OPPERMANN EF:[id number] 1945/12/12  HISTORY OF PRESENT ILLNESS: I had the pleasure of seeing Kristine Cook in follow-up in the neurology clinic on 02/24/2019.  The patient was last seen on a month ago for dementia with behavioral changes. Her daughter Kristine Cook is present today with her husband Kristine Cook on speakerphone to provide additional information. Since her last visit, dose of Abilify was increased to 3mg  qhs which she is tolerating better than prior medications tried. Her husband is at his wits end during sundowning from 5-9PM occurring 75-80% of days. She gets very agitated wanting to go home and not recognizing her husband, and cannot be reasoned with. She would not remember it the next day. She is more alert today and they report she does coloring books and puzzles. She still has a tremor with weakness holding a cup, spilling on her. Overall though her husband feels she is doing better with her hands, she is on low dose Sinemet 25/100mg  1/2 tab before meals (BID, she only eats two meals a day). She needs help with ambulation and is mostly in her wheelchair. She denies any headaches, dizziness, vision changes, focal numbness/tingling. Her neck is sore.   History on Initial Assessment 09/02/2018:This is a pleasant 73 year old right-handed woman with a history of hypertension, hyperlipidemia, left acoustic neuroma s/p gamma knife in 2005, shunt placement in 2004, presenting for evaluation of confusion. She states she is 78. When asked about memory, she states "it worries me, I don't remember my children's names sometimes." Her husband started noticing minor memory changes when she retired in 2009, she has always confused names of distant family members, but symptoms significantly changed in April 2019. She and her husband went on a trip to [place], and he noted she was repeating herself, constantly asking the same thing. She still thought she was in [place] when they  got back. She had previously been having difficulty sleeping and taking higher doses of melatonin, her husband thought this was the caused and stopped melatonin which seemed to help for a while. She started having noticeable difficulties again on 07/21/2018 with "unusual talk." She would not recognize her husband, asking her husband or neighbor to look for her husband when he was standing right in front of her. She does not realize she is at home and wants to put her shoes on to go home. Today her husband went for bloodwork and he left her in the car, when he came back she was convinced they left her father (who is deceased) in the doctor's office. She states she sees and hears her father or family member in the living room, this tends to get worse in the evening. She would get more combative when her husband tries to convince her otherwise. This was initially sporadic in January, but for the past 1-2 weeks has been a daily issue. She was started on Depakote 250mg  daily for behavioral changes last Saturday, no side effects. Her husband reports that she has always had walking difficulties for several years, balance has been worsening the past 2 years, but now forgets that she needs assistance to ambulate and would stand up and fall. Her husband is mostly concerned because these changes seem quite sudden. He has noticed that she is having more difficulty feeding herself with her right hand, with the fork ending up at her chin. She is tired all the time. Her husband denies any staring/unresponsive episodes, but he can tell it is  not registering where she is or what is going on. Her husband has been managing medications and finances for the past 25 years. She stopped driving many years ago due to her eyesight/peripheral vision issues. Sleep is good.   She has infrequent headaches. She has occasional spinning when she stands up. She has had some horizontal diplopia since the neuroma. She has neck and back pain. There is  occasional tingling in both feet. She has bowel and bladder incontinence and wears adult diapers. She has had tremors for several years affecting writing and using utensils. No anosmia. Her father had vascular dementia. No history of significant head injuries or alcohol use.   She had a head CT on 07/22/2018 with stable right VP shunt, ventricles mildly more prominent compared to Jan 2012, however the sulci are also mildly more prominent. Increased ventricular prominence is favored to be due to interval volume loss/atrophy rather than shunt malfuncation, increasing white matter changes. Images were reviewed by his neurosurgeon Dr. Salomon Fick, imaging was stable from prior and follow-up MRI was recommended for 2026. Her last MRI brain was in 05/2016 reporting "Similar size and configuration the ventricular system with unchanged right frontal approach ventriculostomy catheter, with tip in the frontal horn of the right lateral ventricle. Similar size of enhancing left CPA angle mass which measures approximately 21 x 16 mm (previously 23 x 17 mm), compatible with a vestibular schwannoma. As previously demonstrated, this lesion widens the porus acousticus, indents the middle cerebellar peduncle, and closely approximates the cisternal portion of the left 5th cranial nerve. Similar extensive T2 FLAIR signal in the periventricular and subcortical white matter, which is nonspecific, but most commonly reflect the sequela of chronic ischemic microvascular disease. Grossly normal flow-related signal in the major intracranial arteries and dural sinuses."  MRI brain with and without contrast done last 12/27/2018 did not show any acute changes. There was interval decrease in left CP angle mass, it widens the porus acousticus and closely approximates the cisternal portion of the left 5th cranial nerve, but now with less mass effect on the middle cerebellar peduncle. There was similar extensive FLAIR signal in the periventricular and  subcortical white matter most commonly reflect the sequela of chronic ischemic microvascular disease. The ventriculostromy catheter  Tip was in the frontal horn of the right lateral ventricle with no concerning findings.   PAST MEDICAL HISTORY: Past Medical History:  Diagnosis Date   Acoustic neuroma ([place])    benign; underwent some radiation-Lt ear deafness. wears hearing aid right   Arthritis    hands, back, knees   Bradycardia    Crohn's disease ([place])    Depression    GERD (gastroesophageal reflux disease)    Hearing deficit    deaf left ear, right ear hearing aid   Hx of seasonal allergies    Hyperlipidemia    Hypertension    Lumbar herniated disc    Osteoporosis     MEDICATIONS: Current Outpatient Medications on File Prior to Visit  Medication Sig Dispense Refill   ALPRAZolam (XANAX) 1 MG tablet Take 1 mg by mouth daily.      ARIPiprazole (ABILIFY) 2 MG tablet Take 2 tablets at 5pm daily 60 tablet 1   aspirin 81 MG tablet Take 81 mg by mouth daily.     carbidopa-levodopa (SINEMET IR) 25-100 MG tablet Take 1/2 tablet twice a day before meals 30 tablet 5   clidinium-chlordiazePOXIDE (LIBRAX) 2.5-5 MG per capsule Take 1 capsule by mouth Three times a day.  CRESTOR 5 MG tablet Take 5 mg by mouth daily.      desloratadine (CLARINEX) 5 MG tablet Take 5 mg by mouth daily as needed (allergies).      DILTIAZEM HCL CD 360 MG 24 hr capsule Take 360 mg by mouth daily.      estradiol (ESTRACE) 1 MG tablet Take 1 mg by mouth daily.     FeAsp-FeFum -Suc-C-Thre-B12-FA (MULTIGEN PLUS) 50-101-1 MG TABS Take 1 tablet by mouth daily.      Flaxseed, Linseed, (RA FLAX SEED OIL 1000 PO) Take 1 capsule by mouth daily.      FLUoxetine (PROZAC) 20 MG capsule Take 20 mg by mouth 3 (three) times daily.      folic acid (FOLVITE) 1 MG tablet Take 1 mg by mouth daily.      Lactobacillus-Inulin ([place] PO) Take by mouth.     levothyroxine (SYNTHROID,  LEVOTHROID) 50 MCG tablet Take 50 mcg by mouth daily.  11   mesalamine (LIALDA) 1.2 g EC tablet Take by mouth daily with breakfast. 4 tabs daily     metoCLOPramide (REGLAN) 5 MG tablet Take 2.5 mg by mouth daily.      MICARDIS 80 MG tablet Take 80 mg by mouth daily.      Multiple Vitamins-Minerals (CENTRUM SILVER PO) Take by mouth.     Omega-3 Fatty Acids (FISH OIL) 1200 MG CAPS Take 1 capsule by mouth 3 (three) times daily.      ondansetron (ZOFRAN) 4 MG tablet Take 4 mg by mouth every 8 (eight) hours as needed for nausea.      pantoprazole (PROTONIX) 40 MG tablet Take 40 mg by mouth 2 (two) times daily.      QUEtiapine (SEROQUEL) 25 MG tablet Take 1/2 tablet in the evening as needed for agitation. If no side effects after a week, may take 1/2 tablet every 8 hours as needed for agitation 45 tablet 3   spironolactone (ALDACTONE) 25 MG tablet Take 12.5 mg by mouth daily.      No current facility-administered medications on file prior to visit.     ALLERGIES: Allergies  Allergen Reactions   Penicillins Anaphylaxis   Septra [Bactrim] Nausea Only   Vicodin [Hydrocodone-Acetaminophen] Diarrhea and Nausea And Vomiting   Hydrocodone-Acetaminophen Diarrhea and Nausea And Vomiting   Penicillin G Swelling   Sulfamethoxazole-Trimethoprim Nausea And Vomiting    FAMILY HISTORY: Family History  Problem Relation Age of Onset   Hyperlipidemia Father    Hypertension Father    Dementia Father     SOCIAL HISTORY: Social History   Socioeconomic History   Marital status: Married    Spouse name: Not on file   Number of children: Not on file   Years of education: Not on file   Highest education level: Not on file  Occupational History   Not on file  Social Needs   Financial resource strain: Not on file   Food insecurity    Worry: Not on file    Inability: Not on file   Transportation needs    Medical: Not on file    Non-medical: Not on file  Tobacco Use    Smoking status: Never Smoker   Smokeless tobacco: Never Used  Substance and Sexual Activity   Alcohol use: No   Drug use: No   Sexual activity: Not on file  Lifestyle   Physical activity    Days per week: Not on file    Minutes per session: Not on file  Stress: Not on file  Relationships   Social connections    Talks on phone: Not on file    Gets together: Not on file    Attends religious service: Not on file    Active member of club or organization: Not on file    Attends meetings of clubs or organizations: Not on file    Relationship status: Not on file   Intimate partner violence    Fear of current or ex partner: Not on file    Emotionally abused: Not on file    Physically abused: Not on file    Forced sexual activity: Not on file  Other Topics Concern   Not on file  Social History Narrative   Lives with husband in one story home      Wheel chair in and out house      Right handed       REVIEW OF SYSTEMS: Constitutional: No fevers, chills, or sweats, no generalized fatigue, change in appetite Eyes: No visual changes, double vision, eye pain Ear, nose and throat: No hearing loss, ear pain, nasal congestion, sore throat Cardiovascular: No chest pain, palpitations Respiratory:  No shortness of breath at rest or with exertion, wheezes GastrointestinaI: No nausea, vomiting, diarrhea, abdominal pain, fecal incontinence Genitourinary:  No dysuria, urinary retention or frequency Musculoskeletal:  + neck pain, +back pain Integumentary: No rash, pruritus, skin lesions Neurological: as above Psychiatric: No depression, insomnia, anxiety Endocrine: No palpitations, fatigue, diaphoresis, mood swings, change in appetite, change in weight, increased thirst Hematologic/Lymphatic:  No anemia, purpura, petechiae. Allergic/Immunologic: no itchy/runny eyes, nasal congestion, recent allergic reactions, rashes  PHYSICAL EXAM: Vitals:   02/24/19 1439  BP: 138/76  Pulse:  61   General: No acute distress, sitting in wheelchair, more alert today Skin/Extremities: No rash, no edema Neurological Exam: alert and oriented to person, place. No aphasia, mild dysarthria. Fund of knowledge is reduced. Recent and remote memory are impaired.  Attention and concentration are reduced.    Able to name objects and repeat phrases. Cranial nerves: Pupils equal, round, reactive to light.  Extraocular movements intact with no nystagmus. Visual fields full. Facial sensation intact. Left facial asymmetry. Tongue, uvula, palate midline.  Motor: +cogwheeling bilaterally. Muscle strength 5/5 throughout with no pronator drift. She is noted to again orbit around the left arm indicating mild left UE weakness. Left hand fingers are flexed at the joing. Deep tendon reflexes brisk +3 with bilateral Hoffman sign. Finger to nose testing intact.  Gait not tested. She has a resting tremor in the fingers of her left hand.   IMPRESSION: This is a 72 yo RH woman with a history of  hypertension, hyperlipidemia, left acoustic neuroma s/p gamma knife in 2005, shunt placement in 2004, who presented for confusion that appeared to start rather acutely last January 2020. [place] 12/30 in March 2020. MRI brain no acute changes, no shunt abnormalities. There is note of extensive chronic ischemic microvascular disease. She has parkinsonian signs but is also hyperreflexic with mild left upper extremity weakness. I wonder about corticobasal degeneration. CT cervical spine without contrast will be ordered to assess for myelopathy. Her husband is having more difficulties with sundowning and will increase Abilify to 5mg  qhs. She had significant side effects on Depakote in the past but he felt it helped with behavior and would like a retrial of low dose Depakote 125mg  at 5pm. She has been taking Xanax 1mg  daily for many years and will take this at 5pm as well. If  no side effects on medications after 2 weeks, he will increase Sinemet  to 1 tab BID before meals. Continue 24/7 care, follow-up in 3 months.   Thank you for allowing me to participate in her care.  Please do not hesitate to call for any questions or concerns.  The duration of this appointment visit was 30 minutes of face-to-face time with the patient.  Greater than 50% of this time was spent in counseling, explanation of diagnosis, planning of further management, and coordination of care.   Ellouise Newer, M.D.   CC: Dr. Dagmar Hait

## 2019-02-24 NOTE — Patient Instructions (Addendum)
1. Schedule CT cervical spine without contrast. This will be scheduled at [facility]. Their number is [phone]. [place] They will call you to schedule the appointment, but you may call them as well.  2. Increase Abilify to 5mg  every evening  3. Restart Depakote 125mg  every 5pm, can try taking the Xanax 1mg  at 5pm as well  4. After 2 weeks of taking higher dose Abilify and Depakote, if no issues, increase Carbidopa to 1 tablet twice a day before meals  5. Follow-up in 3 months, call for any changes

## 2019-03-17 ENCOUNTER — Other Ambulatory Visit: Payer: Self-pay

## 2019-03-17 ENCOUNTER — Telehealth: Payer: Self-pay | Admitting: Neurology

## 2019-03-17 MED ORDER — ARIPIPRAZOLE 2 MG PO TABS: 4.0000 mg | ORAL_TABLET | Freq: Every day | ORAL | 3 refills | Status: AC

## 2019-03-17 NOTE — Telephone Encounter (Signed)
Spoke with pts husband. He would likw to go back to original dosing of pts medications. He states that pt is not doing anything. She will not swallow pills, will not pick up her food.  He wants pt back to 4mg  of Abilify 1/2 tab of Sinemet and no Depakote. Pt is just too lethargic.  New Rx sent for 4mg  of Abilify.  Will make Dr. Delice Lesch aware.

## 2019-03-17 NOTE — Telephone Encounter (Signed)
Patients husband wants to change from the 5mg  to 4mg  Abilify. She is getting lethargic. She will need a new prescription for this to the pharm on file, 30day supply. Also, he cant tell difference in carbidopa levodopa medication he wants to go back to the 1/2 tab. She is not on the depakote medication because she wasn't able to take it.  Thanks!

## 2019-03-17 NOTE — Telephone Encounter (Signed)
For Aqu[place] to review.

## 2019-03-20 NOTE — Telephone Encounter (Signed)
Called husband and gave him instructions below (MD note)  of how to wean off the Abilify. Husband verbalized understanding. He will call back in 1-2 weeks and let us know how patient is doing as he would like her to get back on it at a lower dose after a period of time without it.  Also let husband know it is fine to stop the Sinemet now. Verbalized understanding of all above.

## 2019-03-20 NOTE — Telephone Encounter (Signed)
Patient's husband is calling in again about medication changes. Thanks!

## 2019-03-20 NOTE — Telephone Encounter (Signed)
Ok to stop the Sinemet. Would wean off Abilify every 3 days instead of stopping cold [place]. Take 4mg  daily for 2 days, then 2 mg daily for 3 days, then stop. Thanks

## 2019-03-20 NOTE — Telephone Encounter (Signed)
Husband wants to know if patient can stop Abilify for a week or two to "clear out" her system. She was doing okay on the 4mg  then when increased to 5mg  seemed very overmedicated/tired/just moving food around in mouth and not eating.  On Friday a script to decrease Abilify to 4mg  was ordered and husband said she started the 4mg  Friday with no improvement. He said in the past when she was like this they stopped the med for a week or then restarted and patient did okay.  Currently: NOT on Depakote. Taking 1/2 tab of Sinemet 25/100 4mg  Abilify.  Husband doesn't think Sinemet is working.  Wanted me to ask Dr. Delice Lesch if can stop Abilify for a week or so AND totally stop Sinemet. Informed husband will pass on to Dr. Delice Lesch and will call him back.

## 2019-03-24 ENCOUNTER — Ambulatory Visit
Admission: RE | Admit: 2019-03-24 | Discharge: 2019-03-24 | Disposition: A | Discharge status: Home / Self Care | Payer: Medicare Other | Source: Ambulatory Visit | Attending: Neurology | Admitting: Neurology

## 2019-03-24 DIAGNOSIS — R292 Abnormal reflex: Secondary | ICD-10-CM

## 2019-04-03 ENCOUNTER — Telehealth: Payer: Self-pay | Admitting: Neurology

## 2019-04-03 NOTE — Telephone Encounter (Signed)
Patient husband is returning [place] call please call

## 2019-04-04 ENCOUNTER — Telehealth: Payer: Self-pay | Admitting: Neurology

## 2019-04-04 NOTE — Telephone Encounter (Signed)
Patient's spouse returned another call to Caryl Pina about his wife's results. Previous telephone encounter closed.

## 2019-04-05 ENCOUNTER — Telehealth: Payer: Self-pay

## 2019-04-05 NOTE — Telephone Encounter (Signed)
Husband made aware of CT results.  He states that he has increased Abilify to 3mg  this Monday and pt seems to be doing well. She can hold a cup. Has not been able to hold a fork.  He will increase to 4mg  next Monday. If pt "crashes" as he calls it or becomes not as alert he will go back to 3mg .  Pt has follow up 05/29/19

## 2019-04-05 NOTE — Telephone Encounter (Signed)
-----   Message from Cameron Sprang, MD sent at 03/27/2019  8:39 AM EDT ----- Pls let husband know the CT scan of her neck did not show any pressure on the spinal cord. It showed arthritis changes and some narrowing of the canal where one of the nerves to her left arm comes out, but nothing to explain her symptoms. Thanks

## 2019-04-05 NOTE — Telephone Encounter (Signed)
Husband informed of CT results. Will discuss options at f/u 05/29/19

## 2019-04-12 ENCOUNTER — Telehealth: Payer: Self-pay | Admitting: Neurology

## 2019-04-12 NOTE — Telephone Encounter (Signed)
Would give 1mg  tonight then stop. Thanks

## 2019-04-12 NOTE — Telephone Encounter (Signed)
Husband informed and understood. He will call back in one week with an update.

## 2019-04-12 NOTE — Telephone Encounter (Signed)
Lethargic, whispers, not swallowing that good. Has been taking 2mg  for 4 days, Started 1mg  last night. Would it be ok to just d/c the medication or does he need to keep pt on 1mg  of Abilify for a little longer?

## 2019-04-12 NOTE — Telephone Encounter (Signed)
Patient's husband called regarding weaning Kristine Cook off a medication. He said he had spoke with Dr. Delice Lesch about it. He has some questions regarding how he is weaning her off. He would like you to please call him so he can let you know how is doing the weaning off. Please call. Thanks

## 2019-04-18 ENCOUNTER — Other Ambulatory Visit: Payer: Self-pay | Admitting: Neurology

## 2019-04-30 DEATH — deceased

## 2019-05-29 ENCOUNTER — Ambulatory Visit: Payer: Medicare Other | Admitting: Neurology
